# Patient Record
Sex: Female | Born: 1965 | Race: White | Hispanic: No | Marital: Married | Smoking: Never smoker
Health system: Southern US, Community
[De-identification: ages and names within clinical notes are randomized; demographics above are authoritative.]

## PROBLEM LIST (undated history)

## (undated) DIAGNOSIS — C419 Malignant neoplasm of bone and articular cartilage, unspecified: Secondary | ICD-10-CM

---

## 2001-06-17 ENCOUNTER — Other Ambulatory Visit: Admission: RE | Admit: 2001-06-17 | Discharge: 2001-06-17 | Payer: Self-pay | Admitting: *Deleted

## 2002-09-20 ENCOUNTER — Other Ambulatory Visit: Admission: RE | Admit: 2002-09-20 | Discharge: 2002-09-20 | Payer: Self-pay | Admitting: *Deleted

## 2003-11-05 ENCOUNTER — Other Ambulatory Visit: Admission: RE | Admit: 2003-11-05 | Discharge: 2003-11-05 | Payer: Self-pay | Admitting: *Deleted

## 2005-08-04 ENCOUNTER — Other Ambulatory Visit: Admission: RE | Admit: 2005-08-04 | Discharge: 2005-08-04 | Payer: Self-pay | Admitting: Obstetrics and Gynecology

## 2006-11-18 ENCOUNTER — Encounter: Admission: RE | Admit: 2006-11-18 | Discharge: 2006-11-18 | Payer: Self-pay | Admitting: Obstetrics and Gynecology

## 2007-11-29 ENCOUNTER — Encounter: Admission: RE | Admit: 2007-11-29 | Discharge: 2007-11-29 | Payer: Self-pay | Admitting: Obstetrics and Gynecology

## 2010-11-16 ENCOUNTER — Encounter: Payer: Self-pay | Admitting: Obstetrics and Gynecology

## 2012-04-05 ENCOUNTER — Other Ambulatory Visit (HOSPITAL_COMMUNITY): Payer: Self-pay | Admitting: Internal Medicine

## 2012-04-05 DIAGNOSIS — Z139 Encounter for screening, unspecified: Secondary | ICD-10-CM

## 2012-04-08 ENCOUNTER — Ambulatory Visit (HOSPITAL_COMMUNITY): Payer: Self-pay

## 2015-10-27 HISTORY — PX: OTHER SURGICAL HISTORY: SHX169

## 2015-12-02 DIAGNOSIS — M1611 Unilateral primary osteoarthritis, right hip: Secondary | ICD-10-CM | POA: Insufficient documentation

## 2015-12-03 DIAGNOSIS — M87051 Idiopathic aseptic necrosis of right femur: Secondary | ICD-10-CM | POA: Insufficient documentation

## 2016-06-17 ENCOUNTER — Other Ambulatory Visit: Payer: Self-pay | Admitting: Family Medicine

## 2016-06-17 DIAGNOSIS — Z1231 Encounter for screening mammogram for malignant neoplasm of breast: Secondary | ICD-10-CM

## 2016-07-20 ENCOUNTER — Ambulatory Visit: Payer: Self-pay

## 2016-07-20 ENCOUNTER — Encounter: Payer: Self-pay | Admitting: Family Medicine

## 2016-08-31 ENCOUNTER — Encounter: Payer: Self-pay | Admitting: Family Medicine

## 2016-08-31 ENCOUNTER — Ambulatory Visit (INDEPENDENT_AMBULATORY_CARE_PROVIDER_SITE_OTHER): Payer: Managed Care, Other (non HMO) | Admitting: Family Medicine

## 2016-08-31 ENCOUNTER — Ambulatory Visit
Admission: RE | Admit: 2016-08-31 | Discharge: 2016-08-31 | Disposition: A | Discharge status: Home / Self Care | Payer: Managed Care, Other (non HMO) | Source: Ambulatory Visit | Attending: Family Medicine | Admitting: Family Medicine

## 2016-08-31 VITALS — BP 130/73 | Ht 69.0 in | Wt 202.0 lb

## 2016-08-31 DIAGNOSIS — Z01419 Encounter for gynecological examination (general) (routine) without abnormal findings: Secondary | ICD-10-CM | POA: Diagnosis not present

## 2016-08-31 DIAGNOSIS — Z1151 Encounter for screening for human papillomavirus (HPV): Secondary | ICD-10-CM | POA: Diagnosis not present

## 2016-08-31 DIAGNOSIS — Z1239 Encounter for other screening for malignant neoplasm of breast: Secondary | ICD-10-CM

## 2016-08-31 DIAGNOSIS — Z1231 Encounter for screening mammogram for malignant neoplasm of breast: Secondary | ICD-10-CM

## 2016-08-31 DIAGNOSIS — Z23 Encounter for immunization: Secondary | ICD-10-CM | POA: Diagnosis not present

## 2016-08-31 DIAGNOSIS — Z124 Encounter for screening for malignant neoplasm of cervix: Secondary | ICD-10-CM | POA: Diagnosis not present

## 2016-08-31 LAB — COMPREHENSIVE METABOLIC PANEL
ALT: 28 U/L (ref 6–29)
AST: 22 U/L (ref 10–35)
Albumin: 4.5 g/dL (ref 3.6–5.1)
Alkaline Phosphatase: 92 U/L (ref 33–115)
BUN: 12 mg/dL (ref 7–25)
CHLORIDE: 105 mmol/L (ref 98–110)
CO2: 26 mmol/L (ref 20–31)
Calcium: 9.6 mg/dL (ref 8.6–10.2)
Creat: 0.96 mg/dL (ref 0.50–1.10)
Glucose, Bld: 97 mg/dL (ref 65–99)
POTASSIUM: 4.2 mmol/L (ref 3.5–5.3)
Sodium: 140 mmol/L (ref 135–146)
TOTAL PROTEIN: 7.1 g/dL (ref 6.1–8.1)
Total Bilirubin: 0.5 mg/dL (ref 0.2–1.2)

## 2016-08-31 LAB — CBC
HEMATOCRIT: 40.4 % (ref 35.0–45.0)
HEMOGLOBIN: 13.4 g/dL (ref 11.7–15.5)
MCH: 31.6 pg (ref 27.0–33.0)
MCHC: 33.2 g/dL (ref 32.0–36.0)
MCV: 95.3 fL (ref 80.0–100.0)
MPV: 10 fL (ref 7.5–12.5)
Platelets: 309 10*3/uL (ref 140–400)
RBC: 4.24 MIL/uL (ref 3.80–5.10)
RDW: 13 % (ref 11.0–15.0)
WBC: 5.1 10*3/uL (ref 3.8–10.8)

## 2016-08-31 LAB — TSH: TSH: 2.46 mIU/L

## 2016-08-31 LAB — LIPID PANEL
Cholesterol: 161 mg/dL (ref <200)
HDL: 45 mg/dL — AB (ref >50)
LDL CALC: 95 mg/dL
TRIGLYCERIDES: 104 mg/dL (ref <150)
Total CHOL/HDL Ratio: 3.6 Ratio (ref <5.0)
VLDL: 21 mg/dL (ref <30)

## 2016-08-31 MED ORDER — TETANUS-DIPHTH-ACELL PERTUSSIS 5-2.5-18.5 LF-MCG/0.5 IM SUSP
0.5000 mL | Freq: Once | INTRAMUSCULAR | Status: AC
  Administered 2016-08-31: 0.5 mL via INTRAMUSCULAR

## 2016-08-31 NOTE — Patient Instructions (Signed)
Preventive Care for Adults, Female A healthy lifestyle and preventive care can promote health and wellness. Preventive health guidelines for women include the following key practices.  A routine yearly physical is a good way to check with your health care provider about your health and preventive screening. It is a chance to share any concerns and updates on your health and to receive a thorough exam.  Visit your dentist for a routine exam and preventive care every 6 months. Brush your teeth twice a day and floss once a day. Good oral hygiene prevents tooth decay and gum disease.  The frequency of eye exams is based on your age, health, family medical history, use of contact lenses, and other factors. Follow your health care provider's recommendations for frequency of eye exams.  Eat a healthy diet. Foods like vegetables, fruits, whole grains, low-fat dairy products, and lean protein foods contain the nutrients you need without too many calories. Decrease your intake of foods high in solid fats, added sugars, and salt. Eat the right amount of calories for you.Get information about a proper diet from your health care provider, if necessary.  Regular physical exercise is one of the most important things you can do for your health. Most adults should get at least 150 minutes of moderate-intensity exercise (any activity that increases your heart rate and causes you to sweat) each week. In addition, most adults need muscle-strengthening exercises on 2 or more days a week.  Maintain a healthy weight. The body mass index (BMI) is a screening tool to identify possible weight problems. It provides an estimate of body fat based on height and weight. Your health care provider can find your BMI and can help you achieve or maintain a healthy weight.For adults 20 years and older:  A BMI below 18.5 is considered underweight.  A BMI of 18.5 to 24.9 is normal.  A BMI of 25 to 29.9 is considered overweight.  A  BMI of 30 and above is considered obese.  Maintain normal blood lipids and cholesterol levels by exercising and minimizing your intake of saturated fat. Eat a balanced diet with plenty of fruit and vegetables. Blood tests for lipids and cholesterol should begin at age 45 and be repeated every 5 years. If your lipid or cholesterol levels are high, you are over 50, or you are at high risk for heart disease, you may need your cholesterol levels checked more frequently.Ongoing high lipid and cholesterol levels should be treated with medicines if diet and exercise are not working.  If you smoke, find out from your health care provider how to quit. If you do not use tobacco, do not start.  Lung cancer screening is recommended for adults aged 45-80 years who are at high risk for developing lung cancer because of a history of smoking. A yearly low-dose CT scan of the lungs is recommended for people who have at least a 30-pack-year history of smoking and are a current smoker or have quit within the past 15 years. A pack year of smoking is smoking an average of 1 pack of cigarettes a day for 1 year (for example: 1 pack a day for 30 years or 2 packs a day for 15 years). Yearly screening should continue until the smoker has stopped smoking for at least 15 years. Yearly screening should be stopped for people who develop a health problem that would prevent them from having lung cancer treatment.  If you are pregnant, do not drink alcohol. If you are  breastfeeding, be very cautious about drinking alcohol. If you are not pregnant and choose to drink alcohol, do not have more than 1 drink per day. One drink is considered to be 12 ounces (355 mL) of beer, 5 ounces (148 mL) of wine, or 1.5 ounces (44 mL) of liquor.  Avoid use of street drugs. Do not share needles with anyone. Ask for help if you need support or instructions about stopping the use of drugs.  High blood pressure causes heart disease and increases the risk  of stroke. Your blood pressure should be checked at least every 1 to 2 years. Ongoing high blood pressure should be treated with medicines if weight loss and exercise do not work.  If you are 55-79 years old, ask your health care provider if you should take aspirin to prevent strokes.  Diabetes screening is done by taking a blood sample to check your blood glucose level after you have not eaten for a certain period of time (fasting). If you are not overweight and you do not have risk factors for diabetes, you should be screened once every 3 years starting at age 45. If you are overweight or obese and you are 40-70 years of age, you should be screened for diabetes every year as part of your cardiovascular risk assessment.  Breast cancer screening is essential preventive care for women. You should practice "breast self-awareness." This means understanding the normal appearance and feel of your breasts and may include breast self-examination. Any changes detected, no matter how small, should be reported to a health care provider. Women in their 20s and 30s should have a clinical breast exam (CBE) by a health care provider as part of a regular health exam every 1 to 3 years. After age 40, women should have a CBE every year. Starting at age 40, women should consider having a mammogram (breast X-ray test) every year. Women who have a family history of breast cancer should talk to their health care provider about genetic screening. Women at a high risk of breast cancer should talk to their health care providers about having an MRI and a mammogram every year.  Breast cancer gene (BRCA)-related cancer risk assessment is recommended for women who have family members with BRCA-related cancers. BRCA-related cancers include breast, ovarian, tubal, and peritoneal cancers. Having family members with these cancers may be associated with an increased risk for harmful changes (mutations) in the breast cancer genes BRCA1 and  BRCA2. Results of the assessment will determine the need for genetic counseling and BRCA1 and BRCA2 testing.  Your health care provider may recommend that you be screened regularly for cancer of the pelvic organs (ovaries, uterus, and vagina). This screening involves a pelvic examination, including checking for microscopic changes to the surface of your cervix (Pap test). You may be encouraged to have this screening done every 3 years, beginning at age 21.  For women ages 30-65, health care providers may recommend pelvic exams and Pap testing every 3 years, or they may recommend the Pap and pelvic exam, combined with testing for human papilloma virus (HPV), every 5 years. Some types of HPV increase your risk of cervical cancer. Testing for HPV may also be done on women of any age with unclear Pap test results.  Other health care providers may not recommend any screening for nonpregnant women who are considered low risk for pelvic cancer and who do not have symptoms. Ask your health care provider if a screening pelvic exam is right for   you.  If you have had past treatment for cervical cancer or a condition that could lead to cancer, you need Pap tests and screening for cancer for at least 20 years after your treatment. If Pap tests have been discontinued, your risk factors (such as having a new sexual partner) need to be reassessed to determine if screening should resume. Some women have medical problems that increase the chance of getting cervical cancer. In these cases, your health care provider may recommend more frequent screening and Pap tests.  Colorectal cancer can be detected and often prevented. Most routine colorectal cancer screening begins at the age of 50 years and continues through age 75 years. However, your health care provider may recommend screening at an earlier age if you have risk factors for colon cancer. On a yearly basis, your health care provider may provide home test kits to check  for hidden blood in the stool. Use of a small camera at the end of a tube, to directly examine the colon (sigmoidoscopy or colonoscopy), can detect the earliest forms of colorectal cancer. Talk to your health care provider about this at age 50, when routine screening begins. Direct exam of the colon should be repeated every 5-10 years through age 75 years, unless early forms of precancerous polyps or small growths are found.  People who are at an increased risk for hepatitis B should be screened for this virus. You are considered at high risk for hepatitis B if:  You were born in a country where hepatitis B occurs often. Talk with your health care provider about which countries are considered high risk.  Your parents were born in a high-risk country and you have not received a shot to protect against hepatitis B (hepatitis B vaccine).  You have HIV or AIDS.  You use needles to inject street drugs.  You live with, or have sex with, someone who has hepatitis B.  You get hemodialysis treatment.  You take certain medicines for conditions like cancer, organ transplantation, and autoimmune conditions.  Hepatitis C blood testing is recommended for all people born from 1945 through 1965 and any individual with known risks for hepatitis C.  Practice safe sex. Use condoms and avoid high-risk sexual practices to reduce the spread of sexually transmitted infections (STIs). STIs include gonorrhea, chlamydia, syphilis, trichomonas, herpes, HPV, and human immunodeficiency virus (HIV). Herpes, HIV, and HPV are viral illnesses that have no cure. They can result in disability, cancer, and death.  You should be screened for sexually transmitted illnesses (STIs) including gonorrhea and chlamydia if:  You are sexually active and are younger than 24 years.  You are older than 24 years and your health care provider tells you that you are at risk for this type of infection.  Your sexual activity has changed  since you were last screened and you are at an increased risk for chlamydia or gonorrhea. Ask your health care provider if you are at risk.  If you are at risk of being infected with HIV, it is recommended that you take a prescription medicine daily to prevent HIV infection. This is called preexposure prophylaxis (PrEP). You are considered at risk if:  You are sexually active and do not regularly use condoms or know the HIV status of your partner(s).  You take drugs by injection.  You are sexually active with a partner who has HIV.  Talk with your health care provider about whether you are at high risk of being infected with HIV. If   you choose to begin PrEP, you should first be tested for HIV. You should then be tested every 3 months for as long as you are taking PrEP.  Osteoporosis is a disease in which the bones lose minerals and strength with aging. This can result in serious bone fractures or breaks. The risk of osteoporosis can be identified using a bone density scan. Women ages 67 years and over and women at risk for fractures or osteoporosis should discuss screening with their health care providers. Ask your health care provider whether you should take a calcium supplement or vitamin D to reduce the rate of osteoporosis.  Menopause can be associated with physical symptoms and risks. Hormone replacement therapy is available to decrease symptoms and risks. You should talk to your health care provider about whether hormone replacement therapy is right for you.  Use sunscreen. Apply sunscreen liberally and repeatedly throughout the day. You should seek shade when your shadow is shorter than you. Protect yourself by wearing long sleeves, pants, a wide-brimmed hat, and sunglasses year round, whenever you are outdoors.  Once a month, do a whole body skin exam, using a mirror to look at the skin on your back. Tell your health care provider of new moles, moles that have irregular borders, moles that  are larger than a pencil eraser, or moles that have changed in shape or color.  Stay current with required vaccines (immunizations).  Influenza vaccine. All adults should be immunized every year.  Tetanus, diphtheria, and acellular pertussis (Td, Tdap) vaccine. Pregnant women should receive 1 dose of Tdap vaccine during each pregnancy. The dose should be obtained regardless of the length of time since the last dose. Immunization is preferred during the 27th-36th week of gestation. An adult who has not previously received Tdap or who does not know her vaccine status should receive 1 dose of Tdap. This initial dose should be followed by tetanus and diphtheria toxoids (Td) booster doses every 10 years. Adults with an unknown or incomplete history of completing a 3-dose immunization series with Td-containing vaccines should begin or complete a primary immunization series including a Tdap dose. Adults should receive a Td booster every 10 years.  Varicella vaccine. An adult without evidence of immunity to varicella should receive 2 doses or a second dose if she has previously received 1 dose. Pregnant females who do not have evidence of immunity should receive the first dose after pregnancy. This first dose should be obtained before leaving the health care facility. The second dose should be obtained 4-8 weeks after the first dose.  Human papillomavirus (HPV) vaccine. Females aged 13-26 years who have not received the vaccine previously should obtain the 3-dose series. The vaccine is not recommended for use in pregnant females. However, pregnancy testing is not needed before receiving a dose. If a female is found to be pregnant after receiving a dose, no treatment is needed. In that case, the remaining doses should be delayed until after the pregnancy. Immunization is recommended for any person with an immunocompromised condition through the age of 61 years if she did not get any or all doses earlier. During the  3-dose series, the second dose should be obtained 4-8 weeks after the first dose. The third dose should be obtained 24 weeks after the first dose and 16 weeks after the second dose.  Zoster vaccine. One dose is recommended for adults aged 30 years or older unless certain conditions are present.  Measles, mumps, and rubella (MMR) vaccine. Adults born  before 1957 generally are considered immune to measles and mumps. Adults born in 1957 or later should have 1 or more doses of MMR vaccine unless there is a contraindication to the vaccine or there is laboratory evidence of immunity to each of the three diseases. A routine second dose of MMR vaccine should be obtained at least 28 days after the first dose for students attending postsecondary schools, health care workers, or international travelers. People who received inactivated measles vaccine or an unknown type of measles vaccine during 1963-1967 should receive 2 doses of MMR vaccine. People who received inactivated mumps vaccine or an unknown type of mumps vaccine before 1979 and are at high risk for mumps infection should consider immunization with 2 doses of MMR vaccine. For females of childbearing age, rubella immunity should be determined. If there is no evidence of immunity, females who are not pregnant should be vaccinated. If there is no evidence of immunity, females who are pregnant should delay immunization until after pregnancy. Unvaccinated health care workers born before 1957 who lack laboratory evidence of measles, mumps, or rubella immunity or laboratory confirmation of disease should consider measles and mumps immunization with 2 doses of MMR vaccine or rubella immunization with 1 dose of MMR vaccine.  Pneumococcal 13-valent conjugate (PCV13) vaccine. When indicated, a person who is uncertain of his immunization history and has no record of immunization should receive the PCV13 vaccine. All adults 65 years of age and older should receive this  vaccine. An adult aged 19 years or older who has certain medical conditions and has not been previously immunized should receive 1 dose of PCV13 vaccine. This PCV13 should be followed with a dose of pneumococcal polysaccharide (PPSV23) vaccine. Adults who are at high risk for pneumococcal disease should obtain the PPSV23 vaccine at least 8 weeks after the dose of PCV13 vaccine. Adults older than 48 years of age who have normal immune system function should obtain the PPSV23 vaccine dose at least 1 year after the dose of PCV13 vaccine.  Pneumococcal polysaccharide (PPSV23) vaccine. When PCV13 is also indicated, PCV13 should be obtained first. All adults aged 65 years and older should be immunized. An adult younger than age 65 years who has certain medical conditions should be immunized. Any person who resides in a nursing home or long-term care facility should be immunized. An adult smoker should be immunized. People with an immunocompromised condition and certain other conditions should receive both PCV13 and PPSV23 vaccines. People with human immunodeficiency virus (HIV) infection should be immunized as soon as possible after diagnosis. Immunization during chemotherapy or radiation therapy should be avoided. Routine use of PPSV23 vaccine is not recommended for American Indians, [place] Natives, or people younger than 65 years unless there are medical conditions that require PPSV23 vaccine. When indicated, people who have unknown immunization and have no record of immunization should receive PPSV23 vaccine. One-time revaccination 5 years after the first dose of PPSV23 is recommended for people aged 19-64 years who have chronic kidney failure, nephrotic syndrome, asplenia, or immunocompromised conditions. People who received 1-2 doses of PPSV23 before age 65 years should receive another dose of PPSV23 vaccine at age 65 years or later if at least 5 years have passed since the previous dose. Doses of PPSV23 are not  needed for people immunized with PPSV23 at or after age 65 years.  Meningococcal vaccine. Adults with asplenia or persistent complement component deficiencies should receive 2 doses of quadrivalent meningococcal conjugate (MenACWY-D) vaccine. The doses should be obtained   at least 2 months apart. Microbiologists working with certain meningococcal bacteria, [place] recruits, people at risk during an outbreak, and people who travel to or live in countries with a high rate of meningitis should be immunized. A first-year college student up through age 34 years who is living in a residence hall should receive a dose if she did not receive a dose on or after her 16th birthday. Adults who have certain high-risk conditions should receive one or more doses of vaccine.  Hepatitis A vaccine. Adults who wish to be protected from this disease, have certain high-risk conditions, work with hepatitis A-infected animals, work in hepatitis A research labs, or travel to or work in countries with a high rate of hepatitis A should be immunized. Adults who were previously unvaccinated and who anticipate close contact with an international adoptee during the first 60 days after arrival in the [place] from a country with a high rate of hepatitis A should be immunized.  Hepatitis B vaccine. Adults who wish to be protected from this disease, have certain high-risk conditions, may be exposed to blood or other infectious body fluids, are household contacts or sex partners of hepatitis B positive people, are clients or workers in certain care facilities, or travel to or work in countries with a high rate of hepatitis B should be immunized.  Haemophilus influenzae type b (Hib) vaccine. A previously unvaccinated person with asplenia or sickle cell disease or having a scheduled splenectomy should receive 1 dose of Hib vaccine. Regardless of previous immunization, a recipient of a hematopoietic stem cell transplant should receive a  3-dose series 6-12 months after her successful transplant. Hib vaccine is not recommended for adults with HIV infection. Preventive Services / Frequency Ages 35 to 4 years  Blood pressure check.** / Every 3-5 years.  Lipid and cholesterol check.** / Every 5 years beginning at age 60.  Clinical breast exam.** / Every 3 years for women in their 71s and 10s.  BRCA-related cancer risk assessment.** / For women who have family members with a BRCA-related cancer (breast, ovarian, tubal, or peritoneal cancers).  Pap test.** / Every 2 years from ages 76 through 26. Every 3 years starting at age 61 through age 76 or 93 with a history of 3 consecutive normal Pap tests.  HPV screening.** / Every 3 years from ages 37 through ages 60 to 51 with a history of 3 consecutive normal Pap tests.  Hepatitis C blood test.** / For any individual with known risks for hepatitis C.  Skin self-exam. / Monthly.  Influenza vaccine. / Every year.  Tetanus, diphtheria, and acellular pertussis (Tdap, Td) vaccine.** / Consult your health care provider. Pregnant women should receive 1 dose of Tdap vaccine during each pregnancy. 1 dose of Td every 10 years.  Varicella vaccine.** / Consult your health care provider. Pregnant females who do not have evidence of immunity should receive the first dose after pregnancy.  HPV vaccine. / 3 doses over 6 months, if 93 and younger. The vaccine is not recommended for use in pregnant females. However, pregnancy testing is not needed before receiving a dose.  Measles, mumps, rubella (MMR) vaccine.** / You need at least 1 dose of MMR if you were born in 1957 or later. You may also need a 2nd dose. For females of childbearing age, rubella immunity should be determined. If there is no evidence of immunity, females who are not pregnant should be vaccinated. If there is no evidence of immunity, females who are  pregnant should delay immunization until after pregnancy.  Pneumococcal  13-valent conjugate (PCV13) vaccine.** / Consult your health care provider.  Pneumococcal polysaccharide (PPSV23) vaccine.** / 1 to 2 doses if you smoke cigarettes or if you have certain conditions.  Meningococcal vaccine.** / 1 dose if you are age 68 to 8 years and a Market researcher living in a residence hall, or have one of several medical conditions, you need to get vaccinated against meningococcal disease. You may also need additional booster doses.  Hepatitis A vaccine.** / Consult your health care provider.  Hepatitis B vaccine.** / Consult your health care provider.  Haemophilus influenzae type b (Hib) vaccine.** / Consult your health care provider. Ages 7 to 53 years  Blood pressure check.** / Every year.  Lipid and cholesterol check.** / Every 5 years beginning at age 25 years.  Lung cancer screening. / Every year if you are aged 11-80 years and have a 30-pack-year history of smoking and currently smoke or have quit within the past 15 years. Yearly screening is stopped once you have quit smoking for at least 15 years or develop a health problem that would prevent you from having lung cancer treatment.  Clinical breast exam.** / Every year after age 48 years.  BRCA-related cancer risk assessment.** / For women who have family members with a BRCA-related cancer (breast, ovarian, tubal, or peritoneal cancers).  Mammogram.** / Every year beginning at age 41 years and continuing for as long as you are in good health. Consult with your health care provider.  Pap test.** / Every 3 years starting at age 65 years through age 37 or 70 years with a history of 3 consecutive normal Pap tests.  HPV screening.** / Every 3 years from ages 72 years through ages 60 to 40 years with a history of 3 consecutive normal Pap tests.  Fecal occult blood test (FOBT) of stool. / Every year beginning at age 21 years and continuing until age 5 years. You may not need to do this test if you get  a colonoscopy every 10 years.  Flexible sigmoidoscopy or colonoscopy.** / Every 5 years for a flexible sigmoidoscopy or every 10 years for a colonoscopy beginning at age 35 years and continuing until age 48 years.  Hepatitis C blood test.** / For all people born from [birth year] through 1965 and any individual with known risks for hepatitis C.  Skin self-exam. / Monthly.  Influenza vaccine. / Every year.  Tetanus, diphtheria, and acellular pertussis (Tdap/Td) vaccine.** / Consult your health care provider. Pregnant women should receive 1 dose of Tdap vaccine during each pregnancy. 1 dose of Td every 10 years.  Varicella vaccine.** / Consult your health care provider. Pregnant females who do not have evidence of immunity should receive the first dose after pregnancy.  Zoster vaccine.** / 1 dose for adults aged 30 years or older.  Measles, mumps, rubella (MMR) vaccine.** / You need at least 1 dose of MMR if you were born in 1957 or later. You may also need a second dose. For females of childbearing age, rubella immunity should be determined. If there is no evidence of immunity, females who are not pregnant should be vaccinated. If there is no evidence of immunity, females who are pregnant should delay immunization until after pregnancy.  Pneumococcal 13-valent conjugate (PCV13) vaccine.** / Consult your health care provider.  Pneumococcal polysaccharide (PPSV23) vaccine.** / 1 to 2 doses if you smoke cigarettes or if you have certain conditions.  Meningococcal vaccine.** /  Consult your health care provider.  Hepatitis A vaccine.** / Consult your health care provider.  Hepatitis B vaccine.** / Consult your health care provider.  Haemophilus influenzae type b (Hib) vaccine.** / Consult your health care provider. Ages 64 years and over  Blood pressure check.** / Every year.  Lipid and cholesterol check.** / Every 5 years beginning at age 23 years.  Lung cancer screening. / Every year if you  are aged 16-80 years and have a 30-pack-year history of smoking and currently smoke or have quit within the past 15 years. Yearly screening is stopped once you have quit smoking for at least 15 years or develop a health problem that would prevent you from having lung cancer treatment.  Clinical breast exam.** / Every year after age 74 years.  BRCA-related cancer risk assessment.** / For women who have family members with a BRCA-related cancer (breast, ovarian, tubal, or peritoneal cancers).  Mammogram.** / Every year beginning at age 44 years and continuing for as long as you are in good health. Consult with your health care provider.  Pap test.** / Every 3 years starting at age 58 years through age 22 or 39 years with 3 consecutive normal Pap tests. Testing can be stopped between 65 and 70 years with 3 consecutive normal Pap tests and no abnormal Pap or HPV tests in the past 10 years.  HPV screening.** / Every 3 years from ages 64 years through ages 70 or 61 years with a history of 3 consecutive normal Pap tests. Testing can be stopped between 65 and 70 years with 3 consecutive normal Pap tests and no abnormal Pap or HPV tests in the past 10 years.  Fecal occult blood test (FOBT) of stool. / Every year beginning at age 40 years and continuing until age 27 years. You may not need to do this test if you get a colonoscopy every 10 years.  Flexible sigmoidoscopy or colonoscopy.** / Every 5 years for a flexible sigmoidoscopy or every 10 years for a colonoscopy beginning at age 7 years and continuing until age 32 years.  Hepatitis C blood test.** / For all people born from [birth year] through 1963 and any individual with known risks for hepatitis C.  Osteoporosis screening.** / A one-time screening for women ages 30 years and over and women at risk for fractures or osteoporosis.  Skin self-exam. / Monthly.  Influenza vaccine. / Every year.  Tetanus, diphtheria, and acellular pertussis (Tdap/Td)  vaccine.** / 1 dose of Td every 10 years.  Varicella vaccine.** / Consult your health care provider.  Zoster vaccine.** / 1 dose for adults aged 35 years or older.  Pneumococcal 13-valent conjugate (PCV13) vaccine.** / Consult your health care provider.  Pneumococcal polysaccharide (PPSV23) vaccine.** / 1 dose for all adults aged 46 years and older.  Meningococcal vaccine.** / Consult your health care provider.  Hepatitis A vaccine.** / Consult your health care provider.  Hepatitis B vaccine.** / Consult your health care provider.  Haemophilus influenzae type b (Hib) vaccine.** / Consult your health care provider. ** Family history and personal history of risk and conditions may change your health care provider's recommendations.   This information is not intended to replace advice given to you by your health care provider. Make sure you discuss any questions you have with your health care provider.   Document Released: 12/08/2001 Document Revised: 11/02/2014 Document Reviewed: 03/09/2011 Elsevier Interactive Patient Education [employer].

## 2016-08-31 NOTE — Progress Notes (Signed)
   Subjective:     Mackenzie Glenn is a 50 y.o. female and is here for a comprehensive physical exam. The patient reports no problems.  Social History   Social History  . Marital status: Married    Spouse name: Gale JourneyDana Dinapoli  . Number of children: 0  . Years of education: 3413   Occupational History  . Not on file.   Social History Main Topics  . Smoking status: Never Smoker  . Smokeless tobacco: Never Used  . Alcohol use 3.6 oz/week    2 Cans of beer, 4 Glasses of wine per week  . Drug use: No  . Sexual activity: Yes    Partners: Male    Birth control/ protection: None   Other Topics Concern  . Not on file   Social History Narrative  . No narrative on file   Health Maintenance  Topic Date Due  . HIV Screening  07/11/1983  . TETANUS/TDAP  07/11/1987  . PAP SMEAR  07/10/1989  . INFLUENZA VACCINE  05/26/2016    The following portions of the patient's history were reviewed and updated as appropriate: allergies, current medications, past family history, past medical history, past social history, past surgical history and problem list.  Review of Systems Pertinent items noted in HPI and remainder of comprehensive ROS otherwise negative.   Objective:    BP 130/73   Ht 5\' 9"  (1.753 m)   Wt 202 lb (91.6 kg)   LMP 09/26/2007 (Approximate)   BMI 29.83 kg/m  General appearance: alert, cooperative and appears stated age Head: Normocephalic, without obvious abnormality, atraumatic Neck: no adenopathy, supple, symmetrical, trachea midline and thyroid not enlarged, symmetric, no tenderness/mass/nodules Lungs: clear to auscultation bilaterally Breasts: normal appearance, no masses or tenderness Heart: regular rate and rhythm, S1, S2 normal, no murmur, click, rub or gallop Abdomen: soft, non-tender; bowel sounds normal; no masses,  no organomegaly Pelvic: cervix normal in appearance, external genitalia normal, no adnexal masses or tenderness, no cervical motion tenderness,  uterus normal size, shape, and consistency and vaginal atrophy Extremities: extremities normal, atraumatic, no cyanosis or edema Pulses: 2+ and symmetric Skin: Skin color, texture, turgor normal. No rashes or lesions Lymph nodes: Cervical, supraclavicular, and axillary nodes normal. Neurologic: Grossly normal    Assessment:    Healthy female exam.      Plan:      Problem List Items Addressed This Visit    None    Visit Diagnoses    Encounter for gynecological examination without abnormal finding    -  Primary   Relevant Orders   Comprehensive metabolic panel   CBC   TSH   Hemoglobin A1c   Lipid panel   VITAMIN D 25 Hydroxy (Vit-D Deficiency, Fractures)   Screening for malignant neoplasm of cervix       Relevant Orders   Cytology - PAP   Screening for breast cancer       Need for immunization against influenza       Relevant Orders   Flu Vaccine QUAD 36+ mos IM (Completed)   Need for immunization against pertussis       Relevant Medications   Tdap (BOOSTRIX) injection 0.5 mL (Completed)      See After Visit Summary for Counseling Recommendations

## 2016-09-01 LAB — HEMOGLOBIN A1C
HEMOGLOBIN A1C: 5.4 % (ref <5.7)
MEAN PLASMA GLUCOSE: 108 mg/dL

## 2016-09-01 LAB — VITAMIN D 25 HYDROXY (VIT D DEFICIENCY, FRACTURES): Vit D, 25-Hydroxy: 44 ng/mL (ref 30–100)

## 2016-09-03 LAB — CYTOLOGY - PAP
ADEQUACY: ABSENT
DIAGNOSIS: NEGATIVE
HPV (WINDOPATH): DETECTED — AB
HPV 16/18/45 GENOTYPING: NEGATIVE

## 2016-10-02 ENCOUNTER — Other Ambulatory Visit: Payer: Self-pay | Admitting: Family Medicine

## 2016-10-02 MED ORDER — FAMCICLOVIR 500 MG PO TABS: 500.0000 mg | ORAL_TABLET | Freq: Three times a day (TID) | ORAL | 0 refills | Status: AC

## 2016-10-02 NOTE — Progress Notes (Unsigned)
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

## 2016-12-29 ENCOUNTER — Encounter: Payer: Self-pay | Admitting: *Deleted

## 2018-11-15 ENCOUNTER — Other Ambulatory Visit: Payer: Self-pay | Admitting: Family Medicine

## 2018-11-15 DIAGNOSIS — Z1231 Encounter for screening mammogram for malignant neoplasm of breast: Secondary | ICD-10-CM

## 2018-11-17 ENCOUNTER — Ambulatory Visit
Admission: RE | Admit: 2018-11-17 | Discharge: 2018-11-17 | Disposition: A | Discharge status: Home / Self Care | Payer: Managed Care, Other (non HMO) | Source: Ambulatory Visit | Attending: Family Medicine | Admitting: Family Medicine

## 2018-11-17 DIAGNOSIS — Z1231 Encounter for screening mammogram for malignant neoplasm of breast: Secondary | ICD-10-CM

## 2020-08-11 IMAGING — MG DIGITAL SCREENING BILATERAL MAMMOGRAM WITH TOMO AND CAD
8 series · 8 of 24 positions shown · non-contrast
Comparison: Previous exam(s).

CLINICAL DATA: Screening.

EXAM:
DIGITAL SCREENING BILATERAL MAMMOGRAM WITH TOMO AND CAD

[L MLO synth-2D]
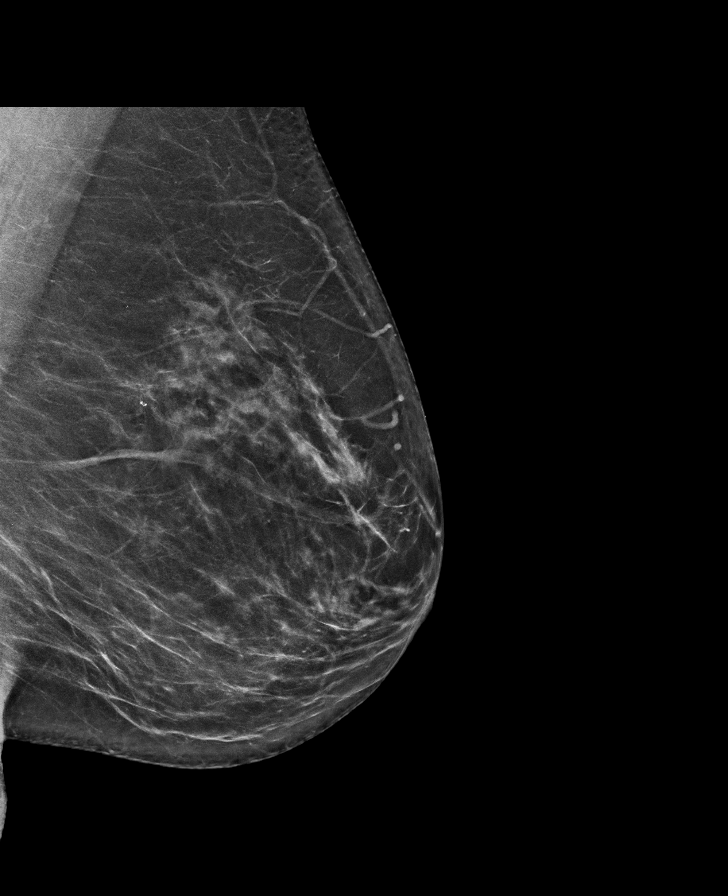

[R MLO synth-2D]
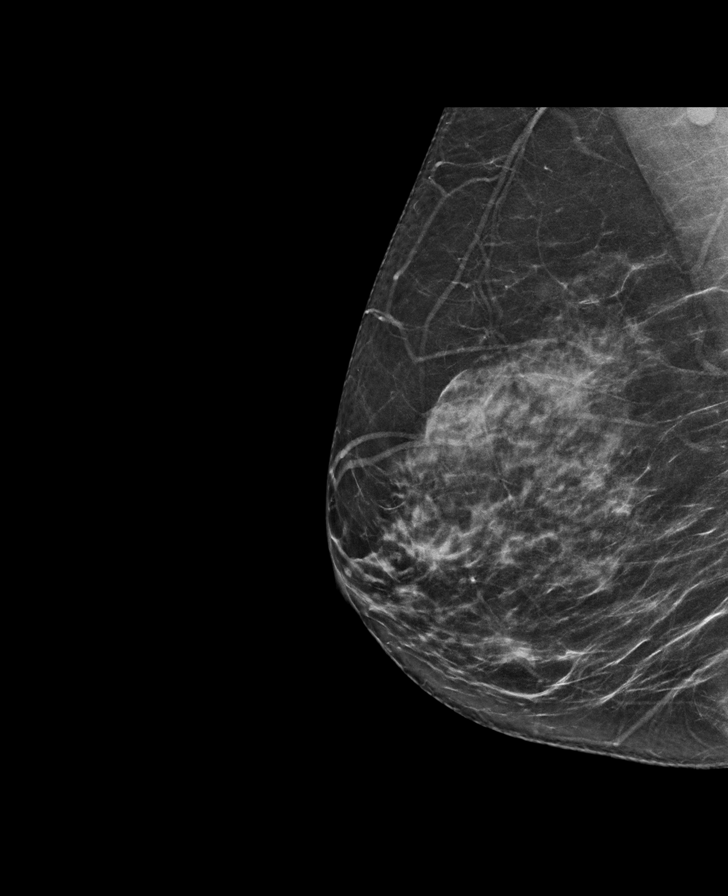

[L CC synth-2D]
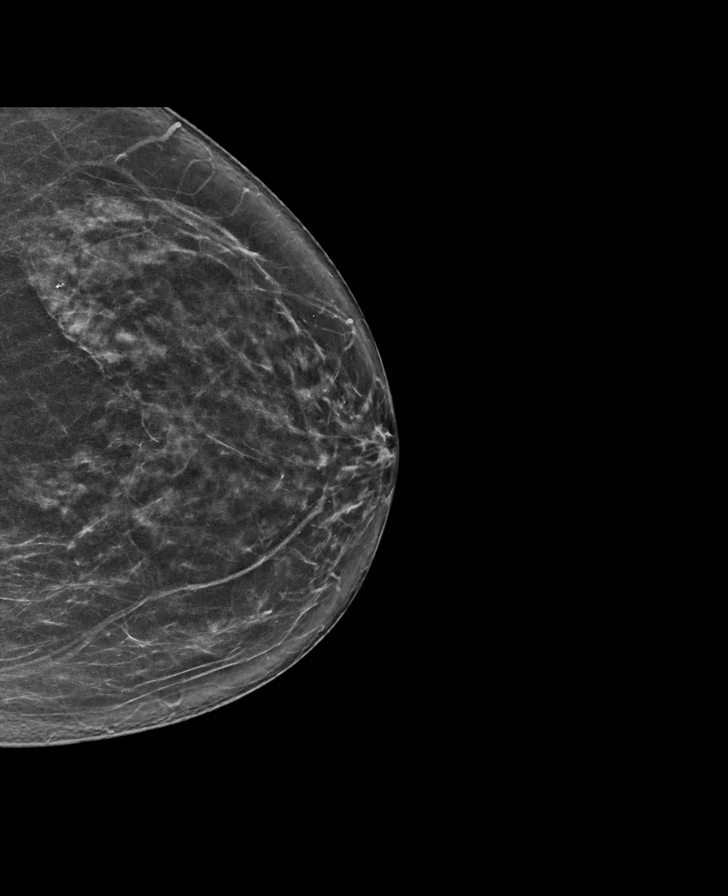

[R CC synth-2D]
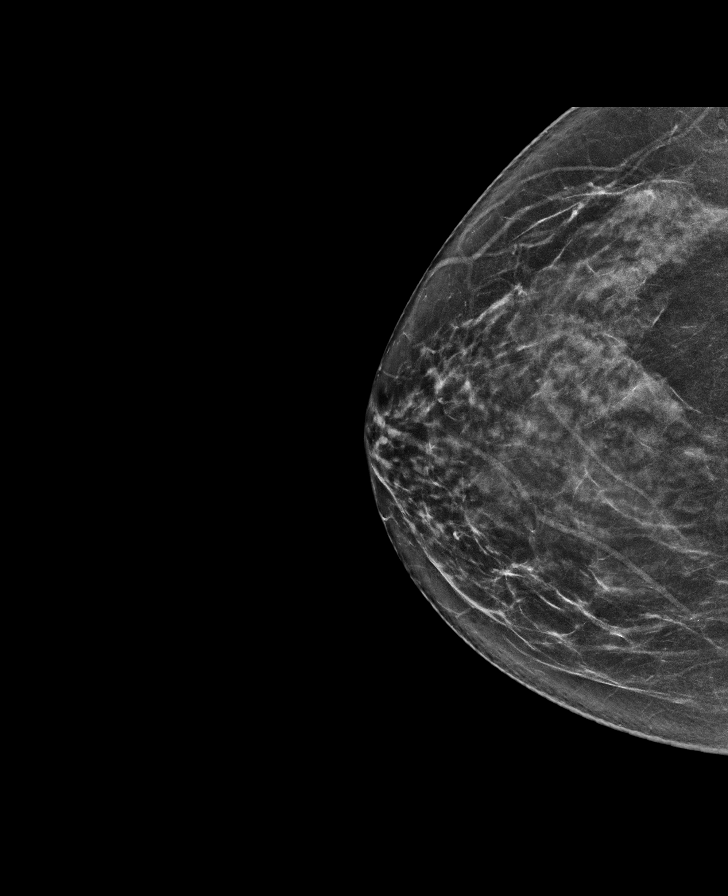

[L CC tomo · tomo slice 37/73.0]
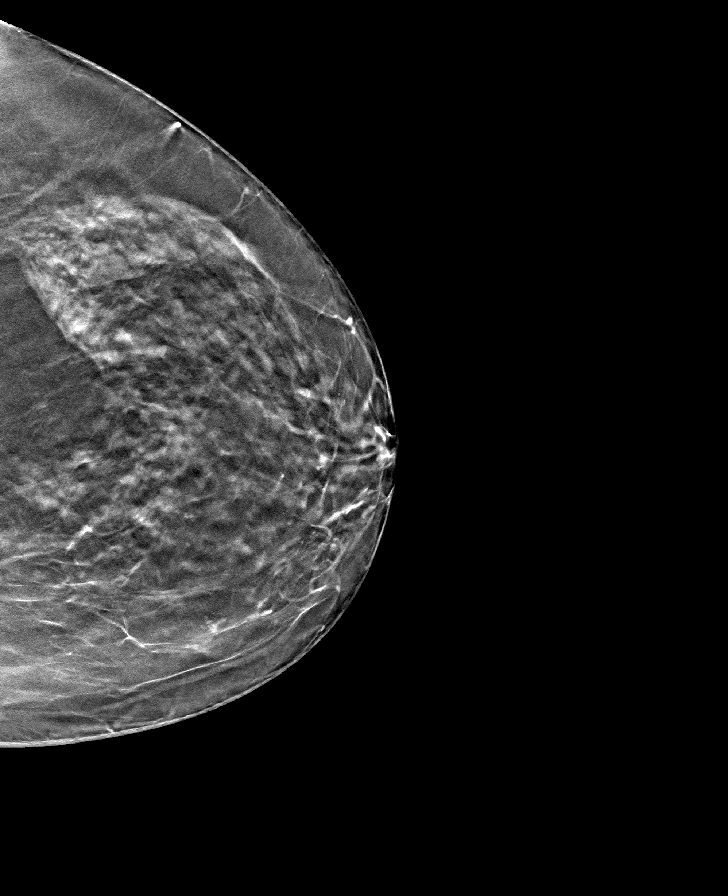

[L MLO tomo · tomo slice 41/80.0]
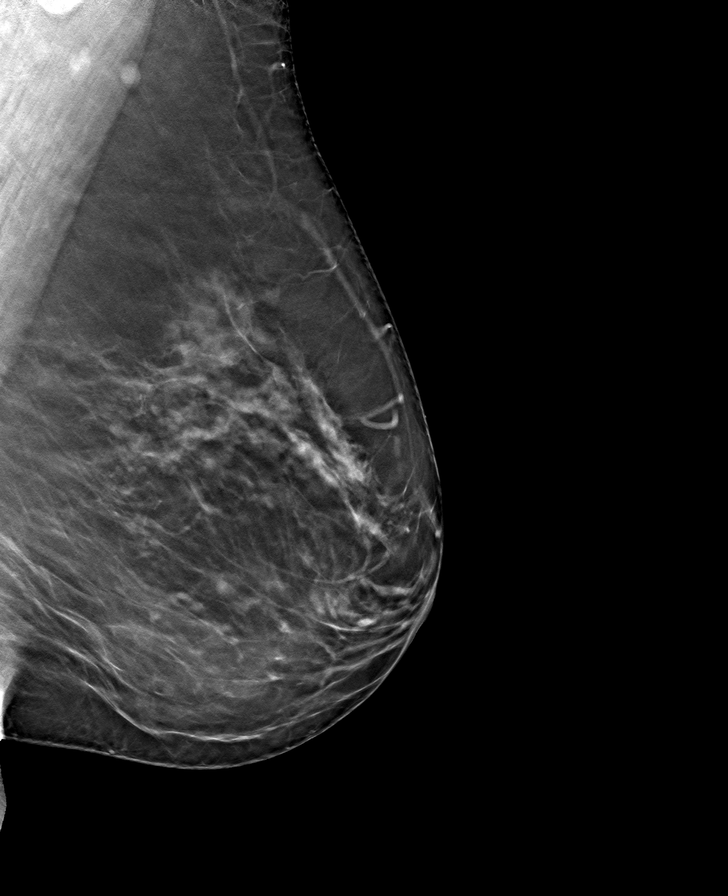

[R CC tomo · tomo slice 37/73.0]
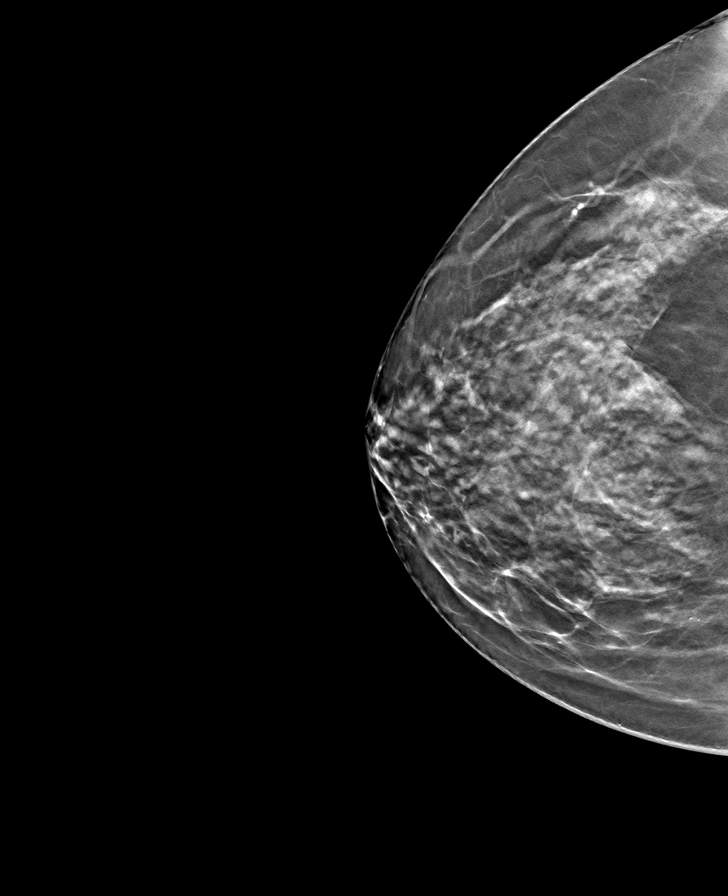

[R MLO tomo · tomo slice 37/74.0]
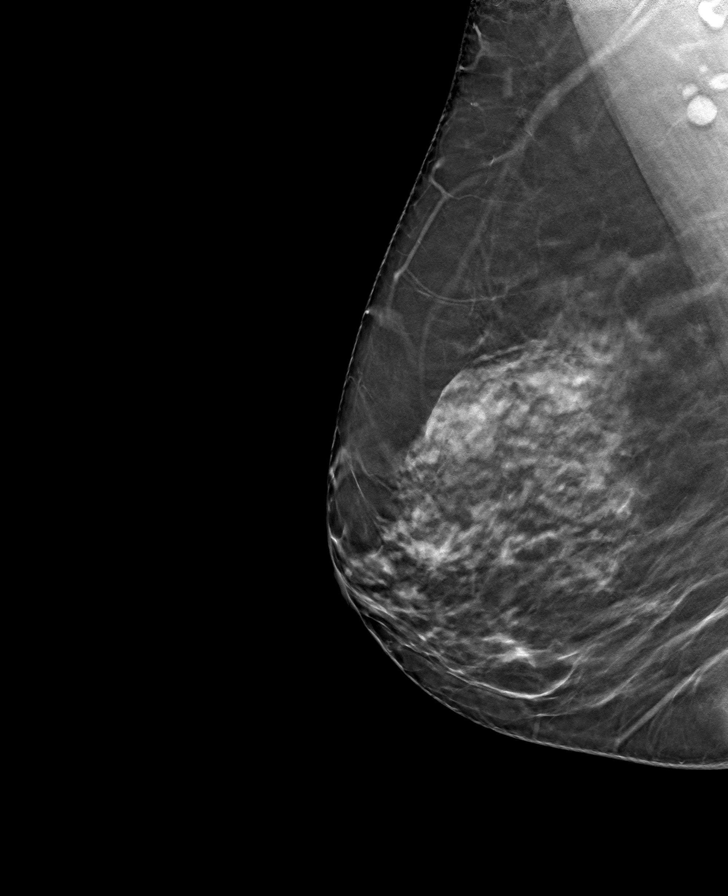

[8 of 24 positions shown; findings below may reference images not displayed]

ACR Breast Density Category b: There are scattered areas of
fibroglandular density.
FINDINGS: There are no findings suspicious for malignancy. Images were
processed with CAD.
IMPRESSION: No mammographic evidence of malignancy. A result letter of this
screening mammogram will be mailed directly to the patient.

RECOMMENDATION:
Screening mammogram in one year. (Code:[id number])

BI-RADS CATEGORY  1: Negative.

## 2021-01-13 ENCOUNTER — Ambulatory Visit: Payer: Managed Care, Other (non HMO) | Admitting: Family Medicine

## 2022-07-21 ENCOUNTER — Other Ambulatory Visit: Payer: Self-pay | Admitting: Family Medicine

## 2022-07-21 DIAGNOSIS — Z1231 Encounter for screening mammogram for malignant neoplasm of breast: Secondary | ICD-10-CM

## 2022-08-10 ENCOUNTER — Ambulatory Visit: Payer: Self-pay

## 2022-08-25 ENCOUNTER — Ambulatory Visit: Payer: Self-pay

## 2022-09-01 ENCOUNTER — Ambulatory Visit
Admission: RE | Admit: 2022-09-01 | Discharge: 2022-09-01 | Disposition: A | Discharge status: Home / Self Care | Payer: Self-pay | Source: Ambulatory Visit | Attending: Family Medicine | Admitting: Family Medicine

## 2022-09-01 DIAGNOSIS — Z1231 Encounter for screening mammogram for malignant neoplasm of breast: Secondary | ICD-10-CM

## 2022-10-05 ENCOUNTER — Ambulatory Visit: Payer: Self-pay

## 2023-08-03 ENCOUNTER — Other Ambulatory Visit: Payer: Self-pay | Admitting: Family Medicine

## 2023-08-03 DIAGNOSIS — Z0000 Encounter for general adult medical examination without abnormal findings: Secondary | ICD-10-CM

## 2023-09-03 ENCOUNTER — Ambulatory Visit
Admission: RE | Admit: 2023-09-03 | Discharge: 2023-09-03 | Disposition: A | Discharge status: Home / Self Care | Payer: BC Managed Care – PPO | Source: Ambulatory Visit | Attending: Family Medicine | Admitting: Family Medicine

## 2023-09-03 DIAGNOSIS — Z0000 Encounter for general adult medical examination without abnormal findings: Secondary | ICD-10-CM

## 2024-08-22 ENCOUNTER — Other Ambulatory Visit: Payer: Self-pay | Admitting: Family Medicine

## 2024-08-22 DIAGNOSIS — Z1231 Encounter for screening mammogram for malignant neoplasm of breast: Secondary | ICD-10-CM

## 2024-09-26 NOTE — Telephone Encounter (Signed)
 Referral sent to [facility] for radiation oncology appt. They will review and call pt with an appt.

## 2024-10-05 ENCOUNTER — Inpatient Hospital Stay
Admission: RE | Admit: 2024-10-05 | Discharge: 2024-10-05 | Disposition: A | Discharge status: Home / Self Care | Payer: Self-pay | Source: Ambulatory Visit | Attending: Radiation Oncology | Admitting: Radiation Oncology

## 2024-10-05 ENCOUNTER — Other Ambulatory Visit: Payer: Self-pay

## 2024-10-05 DIAGNOSIS — C411 Malignant neoplasm of mandible: Secondary | ICD-10-CM

## 2024-10-10 ENCOUNTER — Ambulatory Visit: Admission: RE | Admit: 2024-10-10 | Discharge: 2024-10-10 | Attending: Radiation Oncology

## 2024-10-10 ENCOUNTER — Encounter: Payer: Self-pay | Admitting: Radiation Oncology

## 2024-10-10 ENCOUNTER — Ambulatory Visit
Admission: RE | Admit: 2024-10-10 | Discharge: 2024-10-10 | Attending: Radiation Oncology | Admitting: Radiation Oncology

## 2024-10-10 VITALS — BP 180/100 | HR 81 | Temp 97.5°F | Resp 18 | Ht 69.0 in | Wt 205.6 lb

## 2024-10-10 DIAGNOSIS — L309 Dermatitis, unspecified: Secondary | ICD-10-CM | POA: Diagnosis not present

## 2024-10-10 DIAGNOSIS — M25471 Effusion, right ankle: Secondary | ICD-10-CM | POA: Diagnosis not present

## 2024-10-10 DIAGNOSIS — C411 Malignant neoplasm of mandible: Secondary | ICD-10-CM | POA: Diagnosis present

## 2024-10-10 DIAGNOSIS — I890 Lymphedema, not elsewhere classified: Secondary | ICD-10-CM | POA: Diagnosis not present

## 2024-10-10 HISTORY — DX: Malignant neoplasm of bone and articular cartilage, unspecified: C41.9

## 2024-10-10 NOTE — Progress Notes (Signed)
 Head and Neck Cancer Location of Tumor / Histology:  Squamous Cell Carcinoma of Right Mandible   Patient presented months ago with symptoms of:  Jaw pain and some swelling.  Biopsies revealed:    Nutrition Status Yes No Comments  Weight changes? [x]  []  Lost about 20 pounds since surgery   Swallowing concerns? []  [x]    PEG? []  [x]     Referrals Yes No Comments  Social Work? [x]  []    Dentistry? [x]  []    Swallowing therapy? [x]  []    Nutrition? [x]  []    Med/Onc? [x]  []     Safety Issues Yes No Comments  Prior radiation? []  [x]    Pacemaker/ICD? []  [x]    Possible current pregnancy? []  [x]    Is the patient on methotrexate? []  [x]     Tobacco/Marijuana/Snuff/ETOH use: None  Past/Anticipated interventions by otolaryngology, if any:  Dr. Denys 08/24/2024 Right Segmental Mandibulectomy       Current Complaints / other details:  None    /

## 2024-10-10 NOTE — Progress Notes (Signed)
 Oncology Nurse Navigator Documentation   Met with patient during initial consult with Dr. Izell. She was accompanied by her husband Lonell.  Further introduced myself as her/their Navigator, explained my role as a member of the Care Team. Provided New Patient resource guide binder: Contact information for physicians, this navigator, other members of the Care Team Advance Directive information; provided [facility] AD booklet at their request,  Fall Prevention Patient Safety Plan Financial Assistance Information sheet Symptom Management Clinic information [facility] map with highlight of [facility] SLP Information sheet Head and Neck cancer basics Nutrition information Patient and family support information including Spiritual care/Chaplain information, Peer mentor program, health and wellness classes, and the survivorship program Community resources  Assisted with post-consult appt scheduling. I have contacted Dr. Nila office at [facility] and sent a referral for dental clearance and SPDs.  I toured her to the [facility] treatment area, explained procedures for lobby registration, arrival to Radiation Waiting, and arrival to treatment area.    They verbalized understanding of information provided. I encouraged them to call with questions/concerns moving forward.  Delon Jefferson, RN, BSN, OCN Head & Neck Oncology Nurse Navigator [facility] [phone]

## 2024-10-10 NOTE — Progress Notes (Signed)
 Radiation Oncology         [phone] ________________________________  Initial outpatient Consultation  Name: Mackenzie Glenn MRN: [id number]  Date: 10/10/2024  DOB: November 26, 1965  RR:[id number], No Pcp Per  Denys Reyes Lunger, MD   REFERRING PHYSICIAN: Denys Reyes Lunger, MD  DIAGNOSIS: No diagnosis found.   Cancer Staging  No matching staging information was found for the patient.  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  CHIEF COMPLAINT: Here to discuss management of head/neck cancer  HISTORY OF PRESENT ILLNESS::Mackenzie Glenn is a 58 y.o. female who presented with a persistent lesion in the right mandible that has persisted over the past 20 years. She underwent an excision on 06/20/24 along with a primary repair of the right inferior alveolar nerve and 2 teeth extractions under the care of Dr. Delford. Biopsy pre-operatively demonstrated a dentigerous cyst, which is also what the lesion appeared clinical intraoperatively. Unfortunately, her final pathology demonstrated moderately differentiated, keratinized, invasive squamous cell carcinoma with a background of inflamed dentigerous cyst.   Subsequently, the patient saw Dr. Bari on 07/06/24 who opted to first proceed with CT maxillofacial, STN, and chest to evaluate for metastatic disease. CT max/face performed on 07/11/24 showed a 5.5 cm expansile lytic lesion right mandible with associated cortical destruction and enhancing soft tissue without lymphadenopathy or CT evidence of perineural spread of tumor. CT chest was negative for any pulmonary embolism.    She was then seen in consultation by Dr. Denys on 07/19/24 to discuss further plan. They discussed possibly undergo segmental mandibulectomy with free flap reconstruction, however case will be presented to the tumor board before proceeding. Upon further discussion. She underwent a right segmental mandibulectomy on 08/24/24 and a free flap take back due to  hematoma and necrotic tissue on 08/28/24. Surgical pathology indicated moderately differentiated, keratinizing invasive squamous cell carcinoma with tumor size of 0.6 cm. Tumor invades the bone along with perineural invasion involving the large caliber nerve. Right neck level IB positive for metastatic disease in 2 of 5 lymph nodes. All other lymph nodes are negative for invasive carcinoma.   During her most recent follow up with Dr. Denys on 09/13/24, patient reported recovering well from procedure. He had also recommended undergoing radiation therapy for 4-6 weeks following her procedure.   Swallowing issues, if any: ***  Weight Changes: ***  Pain status: ***  Other symptoms: ***  Tobacco history, if any: none   ETOH abuse, if any: social alcohol use   Prior cancers, if any: none   PATHOLOGY: 08-24-24 A: Right hemimandible lateral margin, biopsy - Benign squamous mucosa and submucosa - Negative for high-grade dysplasia or malignancy   B: Right hemimandible medial margin, biopsy - Benign squamous mucosa and submucosa - Negative for high-grade dysplasia or malignancy   C: Right hemimandible anterior marrow margin, biopsy - Benign bone - Negative for carcinoma   D: Neck level 1A, lymphadenectomy - Four lymph nodes, negative for malignancy (0/4)   E : Right neck level 1B, lymphadenectomy - Four lymph nodes, negative for malignancy (0/4) - Benign salivary gland   F: Right neck level 2A, lymphadenectomy - Metastatic squamous cell carcinoma in two of five lymph nodes (2/5) - Maximum size of metastasis 1.7 cm - Negative for extranodal extension   G: Right neck level 2B, lymphadenectomy - Sixteen lymph nodes, negative for malignancy (0/16)   H: Right neck level 3, lymphadenectomy - Fifteen lymph nodes, negative for malignancy (0/15)   I: Right neck level 4,  lymphadenectomy - Fourteen lymph nodes, negative for malignancy (0/14)   J: Right hemimandible,  hemimandibulectomy - Invasive squamous cell carcinoma, moderately differentiated, keratinizing    - Tumor size: 0.6 cm    - Tumor invades bone    - Perineural invasion present, involving large caliber nerve    - Margins: Negative (closest margin is medial soft tissue margin at 0.2 cm) - Post-surgical changes present - See comment   This electronic signature is attestation that the pathologist personally reviewed the submitted material(s) and the final diagnosis reflects that evaluation.  Electronically signed by Larraine Pinal, MD on 09/08/2024 at 2136 EST  Diagnosis Comment   The frozen section diagnoses are confirmed.   This case is reviewed in conjunction with the patient's prior curettage specimen ([id number]), which showed invasive squamous cell carcinoma rising in a dentigerous cyst with background high-grade squamous dysplasia.  In this mandibulectomy specimen, the tumor consists of a 0.6 cm submucosal deposit with focal extension into bone and focal extension to the mucosal surface with associated ulceration.  There is perineural invasion of a large caliber nerve (favored to be the inferior alveolar nerve).   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  Primary intraosseous carcinoma is a rare central jaw carcinoma that arises from odontogenic cysts or other benign odontogenic precursors.  Although there is no defined AJCC TNM staging system for these tumors, outcomes are similar to stage IV oral cavity squamous cell carcinomas (PMID: [id number]).  Thus, using the oral cavity squamous cell carcinoma staging system as a surrogate to predict clinical behavior and the features of this case, this tumor would be staged as a pT4a N2b.   PREVIOUS RADIATION THERAPY:  No  PAST MEDICAL HISTORY:  has no past medical history on file.    PAST SURGICAL HISTORY: Past Surgical History:  Procedure Laterality Date   bilateral hip replacement  2017   right Feb 2017, Left April 2017    FAMILY HISTORY: family history includes Arthritis in her mother; CVA (age of onset: 16) in her mother; Hypertension in her mother; Hypothyroidism in her mother.  SOCIAL HISTORY:  reports that she has never smoked. She has never used smokeless tobacco. She reports current alcohol use of about 6.0 standard drinks of alcohol per week. She reports that she does not use drugs.  ALLERGIES: Penicillin g, Tetracycline, and Sulfamethoxazole  MEDICATIONS:  Current Outpatient Medications  Medication Sig Dispense Refill   famciclovir  (FAMVIR ) 500 MG tablet Take 1 tablet (500 mg total) by mouth 3 (three) times daily. 60 tablet 0   No current facility-administered medications for this visit.    REVIEW OF SYSTEMS:  Notable for that above.   PHYSICAL EXAM:  vitals were not taken for this visit.   General: Alert and oriented, in no acute distress HEENT: Head is normocephalic. Extraocular movements are intact. Oropharynx is notable for ***. Neck: Neck is notable for *** Heart: Regular in rate and rhythm with no murmurs, rubs, or gallops. Chest: Clear to auscultation bilaterally, with no rhonchi, wheezes, or rales. Abdomen: Soft, nontender, nondistended, with no rigidity or guarding. Extremities: No cyanosis or edema. Lymphatics: see Neck Exam Skin: No concerning lesions. Musculoskeletal: symmetric strength and muscle tone throughout. Neurologic: Cranial nerves II through XII are grossly intact. No obvious focalities. Speech is fluent. Coordination is intact.  Psychiatric: Judgment and insight are intact. Affect is appropriate.   ECOG = ***  0 - Asymptomatic (Fully active, able to carry on all predisease activities without restriction)  1 - Symptomatic but completely ambulatory  (Restricted in physically strenuous activity but ambulatory and able to carry out work of a light or sedentary nature. For example, light housework, office work)  2 - Symptomatic, <50% in bed during the day (Ambulatory and capable of all self care but unable to carry out any work activities. Up and about more than 50% of waking hours)  3 - Symptomatic, >50% in bed, but not bedbound (Capable of only limited self-care, confined to bed or chair 50% or more of waking hours)  4 - Bedbound (Completely disabled. Cannot carry on any self-care. Totally confined to bed or chair)  5 - Death   Raylene MM, Creech RH, Tormey DC, et al. [phone]). Toxicity and response criteria of the [facility]. Am. DOROTHA Bridges. Oncol. 5 (6): 649-55   LABORATORY DATA:  Lab Results  Component Value Date   WBC 5.1 08/31/2016   HGB 13.4 08/31/2016   HCT 40.4 08/31/2016   MCV 95.3 08/31/2016   PLT 309 08/31/2016   CMP     Component Value Date/Time   NA 140 08/31/2016 0918   K 4.2 08/31/2016 0918   CL 105 08/31/2016 0918   CO2 26 08/31/2016 0918   GLUCOSE 97 08/31/2016 0918   BUN 12 08/31/2016 0918   CREATININE 0.96 08/31/2016 0918   CALCIUM 9.6 08/31/2016 0918   PROT 7.1 08/31/2016 0918   ALBUMIN 4.5 08/31/2016 0918   AST 22 08/31/2016 0918   ALT 28 08/31/2016 0918   ALKPHOS 92 08/31/2016 0918   BILITOT 0.5 08/31/2016 0918      Lab Results  Component Value Date   TSH 2.46 08/31/2016     RADIOGRAPHY: No results found.    IMPRESSION/PLAN:  This is a delightful patient with head and neck cancer. I *** recommend radiotherapy for this patient.  We discussed the potential risks, benefits, and side effects of radiotherapy. We talked in detail about acute and late effects. We discussed that some of the most bothersome acute effects may be mucositis, dysgeusia, salivary changes, skin irritation, hair loss, dehydration, weight loss and fatigue. We talked about late effects which include but  are not necessarily limited to dysphagia, hypothyroidism, nerve injury, vascular injury, spinal cord injury, xerostomia, trismus, neck edema, dental issues, non-healing wound, and potentially fatal injury to any of the tissues in the head and neck region. No guarantees of treatment were given. A consent form was signed and placed in the patient's medical record. The patient is enthusiastic about proceeding with treatment. I look forward to participating in the patient's care.    Simulation (treatment planning) will take place ***  We also discussed that the treatment of head and neck cancer is a multidisciplinary process to maximize treatment outcomes and quality of life. For this reason the following referrals have been or will be made:  *** Medical oncology to discuss chemotherapy   *** Dentistry for dental evaluation, possible extractions in the radiation fields, and /or advice on reducing risk of cavities, osteoradionecrosis, or other oral issues.  *** Nutritionist for nutrition support during and after treatment.  *** Speech language pathology for swallowing and/or speech therapy.  *** Social work for social support.   *** Physical therapy due to risk of lymphedema in neck and deconditioning.  *** Baseline labs including TSH.  On date of service, in total, I spent *** minutes on this encounter. Patient was seen in person.  __________________________________________   Lauraine Golden, MD  This document serves as a record of services personally performed by Lauraine Golden, MD. It was created on her behalf by Reymundo Cartwright, a trained medical scribe. The creation of this record is based on the scribe's personal observations and the provider's statements to them. This document has been checked and approved by the attending provider.

## 2024-10-11 ENCOUNTER — Encounter: Payer: Self-pay | Admitting: Radiation Oncology

## 2024-10-11 DIAGNOSIS — C411 Malignant neoplasm of mandible: Secondary | ICD-10-CM | POA: Insufficient documentation

## 2024-10-11 NOTE — Progress Notes (Signed)
 Dental Form with Estimates of Radiation Dose  BILLIJO DILLING Date of birth: November 16, 1965        Cancer Staging  Cancer of mandible [facility]) Staging form: Oral Cavity, AJCC 8th Edition - Pathologic stage from 10/11/2024: Stage IVA (pT4a, pN2b, cM0) - Signed by Izell Domino, MD on 10/11/2024 Stage prefix: Initial diagnosis   Prognosis: curative  Anticipated # of fractions: 30    Daily?: yes  # of weeks of radiotherapy: 6  Chemotherapy?: no  Anticipated xerostomia:  Mild permanent    Pre-simulation needs:   Scatter protection / dental clearance and advice  Simulation: ASAP    Other Notes:   Please contact Domino Izell, MD, with patient's disposition after evaluation and/or dental treatment. -----------------------------------  Domino Izell, MD

## 2024-10-23 ENCOUNTER — Other Ambulatory Visit: Payer: Self-pay

## 2024-10-23 DIAGNOSIS — C411 Malignant neoplasm of mandible: Secondary | ICD-10-CM

## 2024-10-23 NOTE — Progress Notes (Signed)
 Has armband been applied?  Yes.    Does patient have an allergy to IV contrast dye?: No.   Has patient ever received premedication for IV contrast dye?: No.   Date of lab work: October 31, 2024 BUN: 13 CR: 0.94 eGFR: >60  Does patient take metformin?: No.   IV site: antecubital left, condition patent and no redness  Has IV site been added to flowsheet?  Yes.    BP (!) 146/77 (BP Location: Left Arm, Patient Position: Sitting)   Pulse 81   Temp (!) 97.5 F (36.4 C)   Resp 16   Wt 203 lb (92.1 kg)   LMP 09/26/2007   SpO2 100%   BMI 29.98 kg/m

## 2024-10-31 ENCOUNTER — Ambulatory Visit: Admission: RE | Admit: 2024-10-31 | Discharge: 2024-10-31 | Disposition: A | Source: Ambulatory Visit

## 2024-10-31 ENCOUNTER — Ambulatory Visit
Admission: RE | Admit: 2024-10-31 | Discharge: 2024-10-31 | Disposition: A | Discharge status: Home / Self Care | Source: Ambulatory Visit | Attending: Radiation Oncology | Admitting: Radiation Oncology

## 2024-10-31 ENCOUNTER — Other Ambulatory Visit: Payer: Self-pay

## 2024-10-31 ENCOUNTER — Ambulatory Visit
Admission: RE | Admit: 2024-10-31 | Discharge: 2024-10-31 | Disposition: A | Source: Ambulatory Visit | Attending: Radiation Oncology

## 2024-10-31 ENCOUNTER — Ambulatory Visit
Admission: RE | Admit: 2024-10-31 | Discharge: 2024-10-31 | Disposition: A | Source: Ambulatory Visit | Attending: Family Medicine | Admitting: Family Medicine

## 2024-10-31 ENCOUNTER — Encounter

## 2024-10-31 VITALS — BP 146/77 | HR 81 | Temp 97.5°F | Resp 16 | Wt 203.0 lb

## 2024-10-31 DIAGNOSIS — C411 Malignant neoplasm of mandible: Secondary | ICD-10-CM | POA: Diagnosis present

## 2024-10-31 DIAGNOSIS — Z510 Encounter for antineoplastic radiation therapy: Secondary | ICD-10-CM | POA: Diagnosis present

## 2024-10-31 DIAGNOSIS — Z1231 Encounter for screening mammogram for malignant neoplasm of breast: Secondary | ICD-10-CM

## 2024-10-31 LAB — BASIC METABOLIC PANEL - CANCER CENTER ONLY
Anion gap: 9 (ref 5–15)
BUN: 13 mg/dL (ref 6–20)
CO2: 27 mmol/L (ref 22–32)
Calcium: 10.1 mg/dL (ref 8.9–10.3)
Chloride: 103 mmol/L (ref 98–111)
Creatinine: 0.94 mg/dL (ref 0.44–1.00)
GFR, Estimated: >60 mL/min
Glucose, Bld: 101 mg/dL — ABNORMAL HIGH (ref 70–99)
Potassium: 4.7 mmol/L (ref 3.5–5.1)
Sodium: 139 mmol/L (ref 135–145)

## 2024-10-31 NOTE — Progress Notes (Signed)
 Oncology Nurse Navigator Documentation   To provide support, encouragement and care continuity, met with Ms. Labell and her husband before her CT SIM.  She tolerated procedure without difficulty, denied questions/concerns.     I encouraged them to call me prior to 11/09/24 New Start.   Delon Jefferson RN, BSN, OCN Head & Neck Oncology Nurse Navigator [facility] at [facility] Phone # [phone]  Fax # [phone]

## 2024-11-01 ENCOUNTER — Telehealth: Payer: Self-pay

## 2024-11-01 NOTE — Telephone Encounter (Signed)
 [facility] Clinical Social Work  Clinical Social Work was referred by statistician for assessment of psychosocial needs.  Clinical Social Worker attempted to contact patient by phone to offer support and assess for needs.  No Answer, left vm. Will make final attempt.   Lizbeth Sprague, LCSW  Clinical Social Worker [facility]

## 2024-11-02 ENCOUNTER — Ambulatory Visit: Payer: Self-pay | Admitting: Family Medicine

## 2024-11-06 DIAGNOSIS — Z510 Encounter for antineoplastic radiation therapy: Secondary | ICD-10-CM | POA: Diagnosis not present

## 2024-11-09 ENCOUNTER — Other Ambulatory Visit: Payer: Self-pay

## 2024-11-09 ENCOUNTER — Ambulatory Visit
Admission: RE | Admit: 2024-11-09 | Discharge: 2024-11-09 | Disposition: A | Source: Ambulatory Visit | Attending: Radiation Oncology | Admitting: Radiation Oncology

## 2024-11-09 DIAGNOSIS — Z510 Encounter for antineoplastic radiation therapy: Secondary | ICD-10-CM | POA: Diagnosis not present

## 2024-11-09 LAB — RAD ONC ARIA SESSION SUMMARY
Course Elapsed Days: 0
Plan Fractions Treated to Date: 1
Plan Prescribed Dose Per Fraction: 2 Gy
Plan Total Fractions Prescribed: 30
Plan Total Prescribed Dose: 60 Gy
Reference Point Dosage Given to Date: 2 Gy
Reference Point Session Dosage Given: 2 Gy
Session Number: 1

## 2024-11-09 NOTE — Progress Notes (Signed)
 Oncology Nurse Navigator Documentation  To provide support, encouragement and care continuity, met with Ms. Gunnells for her initial RT.  She was accompanied by her husband. I reviewed the 2-step treatment process, answered questions.  Ms. Thorp completed treatment without difficulty, denied questions/concerns. I reviewed the registration/arrival procedure for subsequent treatments. I encouraged them to call me with questions/concerns as treatments proceed.   Delon Jefferson RN, BSN, OCN Head & Neck Oncology Nurse Navigator [facility] at [facility] Phone # [phone]  Fax # [phone]

## 2024-11-10 ENCOUNTER — Ambulatory Visit
Admission: RE | Admit: 2024-11-10 | Discharge: 2024-11-10 | Disposition: A | Discharge status: Home / Self Care | Source: Ambulatory Visit | Attending: Radiation Oncology | Admitting: Radiation Oncology

## 2024-11-10 ENCOUNTER — Other Ambulatory Visit: Payer: Self-pay

## 2024-11-10 DIAGNOSIS — Z510 Encounter for antineoplastic radiation therapy: Secondary | ICD-10-CM | POA: Diagnosis not present

## 2024-11-10 LAB — RAD ONC ARIA SESSION SUMMARY
Course Elapsed Days: 1
Plan Fractions Treated to Date: 2
Plan Prescribed Dose Per Fraction: 2 Gy
Plan Total Fractions Prescribed: 30
Plan Total Prescribed Dose: 60 Gy
Reference Point Dosage Given to Date: 4 Gy
Reference Point Session Dosage Given: 2 Gy
Session Number: 2

## 2024-11-13 ENCOUNTER — Ambulatory Visit: Admitting: Radiation Oncology

## 2024-11-13 ENCOUNTER — Other Ambulatory Visit: Payer: Self-pay

## 2024-11-13 ENCOUNTER — Ambulatory Visit
Admission: RE | Admit: 2024-11-13 | Discharge: 2024-11-13 | Disposition: A | Source: Ambulatory Visit | Attending: Radiation Oncology

## 2024-11-13 ENCOUNTER — Ambulatory Visit

## 2024-11-13 DIAGNOSIS — C411 Malignant neoplasm of mandible: Secondary | ICD-10-CM

## 2024-11-13 DIAGNOSIS — Z510 Encounter for antineoplastic radiation therapy: Secondary | ICD-10-CM | POA: Diagnosis not present

## 2024-11-13 LAB — RAD ONC ARIA SESSION SUMMARY
Course Elapsed Days: 4
Plan Fractions Treated to Date: 3
Plan Prescribed Dose Per Fraction: 2 Gy
Plan Total Fractions Prescribed: 30
Plan Total Prescribed Dose: 60 Gy
Reference Point Dosage Given to Date: 6 Gy
Reference Point Session Dosage Given: 2 Gy
Session Number: 3

## 2024-11-14 ENCOUNTER — Other Ambulatory Visit: Payer: Self-pay

## 2024-11-14 ENCOUNTER — Other Ambulatory Visit: Payer: Self-pay | Admitting: Radiation Oncology

## 2024-11-14 ENCOUNTER — Ambulatory Visit
Admission: RE | Admit: 2024-11-14 | Discharge: 2024-11-14 | Disposition: A | Source: Ambulatory Visit | Attending: Radiation Oncology

## 2024-11-14 ENCOUNTER — Ambulatory Visit: Admission: RE | Admit: 2024-11-14 | Discharge: 2024-11-14 | Attending: Radiation Oncology

## 2024-11-14 DIAGNOSIS — C411 Malignant neoplasm of mandible: Secondary | ICD-10-CM

## 2024-11-14 DIAGNOSIS — Z510 Encounter for antineoplastic radiation therapy: Secondary | ICD-10-CM | POA: Diagnosis not present

## 2024-11-14 LAB — RAD ONC ARIA SESSION SUMMARY
Course Elapsed Days: 5
Plan Fractions Treated to Date: 4
Plan Prescribed Dose Per Fraction: 2 Gy
Plan Total Fractions Prescribed: 30
Plan Total Prescribed Dose: 60 Gy
Reference Point Dosage Given to Date: 8 Gy
Reference Point Session Dosage Given: 2 Gy
Session Number: 4

## 2024-11-14 MED ORDER — LIDOCAINE VISCOUS HCL 2 % MT SOLN: OROMUCOSAL | 3 refills | Status: AC

## 2024-11-15 ENCOUNTER — Ambulatory Visit
Admission: RE | Admit: 2024-11-15 | Discharge: 2024-11-15 | Disposition: A | Source: Ambulatory Visit | Attending: Radiation Oncology

## 2024-11-15 ENCOUNTER — Other Ambulatory Visit: Payer: Self-pay

## 2024-11-15 DIAGNOSIS — Z510 Encounter for antineoplastic radiation therapy: Secondary | ICD-10-CM | POA: Diagnosis not present

## 2024-11-15 LAB — RAD ONC ARIA SESSION SUMMARY
Course Elapsed Days: 6
Plan Fractions Treated to Date: 5
Plan Prescribed Dose Per Fraction: 2 Gy
Plan Total Fractions Prescribed: 30
Plan Total Prescribed Dose: 60 Gy
Reference Point Dosage Given to Date: 10 Gy
Reference Point Session Dosage Given: 2 Gy
Session Number: 5

## 2024-11-16 ENCOUNTER — Ambulatory Visit
Admission: RE | Admit: 2024-11-16 | Discharge: 2024-11-16 | Disposition: A | Discharge status: Home / Self Care | Source: Ambulatory Visit | Attending: Radiation Oncology | Admitting: Radiation Oncology

## 2024-11-16 ENCOUNTER — Other Ambulatory Visit: Payer: Self-pay

## 2024-11-16 ENCOUNTER — Inpatient Hospital Stay: Attending: Nutrition | Admitting: Nutrition

## 2024-11-16 DIAGNOSIS — Z510 Encounter for antineoplastic radiation therapy: Secondary | ICD-10-CM | POA: Diagnosis not present

## 2024-11-16 LAB — RAD ONC ARIA SESSION SUMMARY
Course Elapsed Days: 7
Plan Fractions Treated to Date: 6
Plan Prescribed Dose Per Fraction: 2 Gy
Plan Total Fractions Prescribed: 30
Plan Total Prescribed Dose: 60 Gy
Reference Point Dosage Given to Date: 12 Gy
Reference Point Session Dosage Given: 2 Gy
Session Number: 6

## 2024-11-16 NOTE — Progress Notes (Signed)
Patient did not come to nutrition appointment.

## 2024-11-17 ENCOUNTER — Other Ambulatory Visit: Payer: Self-pay

## 2024-11-17 ENCOUNTER — Ambulatory Visit
Admission: RE | Admit: 2024-11-17 | Discharge: 2024-11-17 | Disposition: A | Source: Ambulatory Visit | Attending: Radiation Oncology

## 2024-11-17 DIAGNOSIS — Z510 Encounter for antineoplastic radiation therapy: Secondary | ICD-10-CM | POA: Diagnosis not present

## 2024-11-17 LAB — RAD ONC ARIA SESSION SUMMARY
Course Elapsed Days: 8
Plan Fractions Treated to Date: 7
Plan Prescribed Dose Per Fraction: 2 Gy
Plan Total Fractions Prescribed: 30
Plan Total Prescribed Dose: 60 Gy
Reference Point Dosage Given to Date: 14 Gy
Reference Point Session Dosage Given: 2 Gy
Session Number: 7

## 2024-11-20 ENCOUNTER — Ambulatory Visit

## 2024-11-21 ENCOUNTER — Ambulatory Visit
Admission: RE | Admit: 2024-11-21 | Discharge: 2024-11-21 | Disposition: A | Source: Ambulatory Visit | Attending: Radiation Oncology

## 2024-11-21 ENCOUNTER — Inpatient Hospital Stay: Admitting: Dietician

## 2024-11-21 ENCOUNTER — Other Ambulatory Visit: Payer: Self-pay

## 2024-11-21 ENCOUNTER — Ambulatory Visit: Admission: RE | Admit: 2024-11-21 | Discharge: 2024-11-21 | Attending: Radiation Oncology

## 2024-11-21 DIAGNOSIS — Z510 Encounter for antineoplastic radiation therapy: Secondary | ICD-10-CM | POA: Diagnosis not present

## 2024-11-21 LAB — RAD ONC ARIA SESSION SUMMARY
Course Elapsed Days: 12
Plan Fractions Treated to Date: 8
Plan Prescribed Dose Per Fraction: 2 Gy
Plan Total Fractions Prescribed: 30
Plan Total Prescribed Dose: 60 Gy
Reference Point Dosage Given to Date: 16 Gy
Reference Point Session Dosage Given: 2 Gy
Session Number: 8

## 2024-11-21 NOTE — Progress Notes (Signed)
 Nutrition Assessment   Reason for Assessment: HNC  ASSESSMENT: 59 year old with SCC of right mandible. S/p segmental mandibulectomy ([facility]) 07/2024. Patient receiving adjuvant radiation under the care of Dr. Izell   No pertinent past medical history   Met with patient and husband in office following treatment. Patient feeling good today. Tolerating treatment well so far with diminished taste of some foods. Recalls toast having no flavor this morning. Patient endorses good appetite and eats 3 meals + snacks in between. Tolerating regular diet at this time. Reports right sided jaw tightness in the last 4-5 days. She is doing exercises as prescribed at recent [facility] follow-up. Patient reports drinking 60+ ounces of water as well as coffee, tea, and daily ginger ale or coke.   Nutrition Focused Physical Exam: deferred    Medications: famciclovir , xylocaine , MVI   Labs: 1/6 labs reviewed    Anthropometrics:  Height: 5'9 Weight: 209.8 lb (1/20 - aria) UBW: 200 lb  BMI: 30.9    NUTRITION DIAGNOSIS: Predicted sub-optimal intake related to [facility] as evidenced by s/p segmental mandibulectomy, anticipated side effects of therapy (taste changes, dry mouth, thick saliva, sore throat) to affect ability to eat/drink orally   INTERVENTION:  Continue eating 3 meals + snacks, recommend protein source at every meal - soft moist high protein foods list Continue baking soda salt water gargles  Provided samples of ONS for pt to try - discussed these as a tool to maintain nutrition if appetite decreases   MONITORING, EVALUATION, GOAL: Patient will tolerate adequate calories and protein to minimize wt loss during treatment   Next Visit: Tuesday February 3 after RT

## 2024-11-22 ENCOUNTER — Other Ambulatory Visit: Payer: Self-pay

## 2024-11-22 ENCOUNTER — Ambulatory Visit
Admission: RE | Admit: 2024-11-22 | Discharge: 2024-11-22 | Disposition: A | Source: Ambulatory Visit | Attending: Radiation Oncology

## 2024-11-22 LAB — RAD ONC ARIA SESSION SUMMARY
Course Elapsed Days: 13
Plan Fractions Treated to Date: 9
Plan Prescribed Dose Per Fraction: 2 Gy
Plan Total Fractions Prescribed: 30
Plan Total Prescribed Dose: 60 Gy
Reference Point Dosage Given to Date: 18 Gy
Reference Point Session Dosage Given: 2 Gy
Session Number: 9

## 2024-11-23 ENCOUNTER — Ambulatory Visit
Admission: RE | Admit: 2024-11-23 | Discharge: 2024-11-23 | Disposition: A | Discharge status: Home / Self Care | Source: Ambulatory Visit | Attending: Radiation Oncology | Admitting: Radiation Oncology

## 2024-11-23 ENCOUNTER — Other Ambulatory Visit: Payer: Self-pay

## 2024-11-23 LAB — RAD ONC ARIA SESSION SUMMARY
Course Elapsed Days: 14
Plan Fractions Treated to Date: 10
Plan Prescribed Dose Per Fraction: 2 Gy
Plan Total Fractions Prescribed: 30
Plan Total Prescribed Dose: 60 Gy
Reference Point Dosage Given to Date: 20 Gy
Reference Point Session Dosage Given: 2 Gy
Session Number: 10

## 2024-11-24 ENCOUNTER — Ambulatory Visit
Admission: RE | Admit: 2024-11-24 | Discharge: 2024-11-24 | Disposition: A | Source: Ambulatory Visit | Attending: Radiation Oncology

## 2024-11-24 ENCOUNTER — Other Ambulatory Visit: Payer: Self-pay

## 2024-11-24 LAB — RAD ONC ARIA SESSION SUMMARY
Course Elapsed Days: 15
Plan Fractions Treated to Date: 11
Plan Prescribed Dose Per Fraction: 2 Gy
Plan Total Fractions Prescribed: 30
Plan Total Prescribed Dose: 60 Gy
Reference Point Dosage Given to Date: 22 Gy
Reference Point Session Dosage Given: 2 Gy
Session Number: 11

## 2024-11-27 ENCOUNTER — Ambulatory Visit

## 2024-11-28 ENCOUNTER — Ambulatory Visit
Admission: RE | Admit: 2024-11-28 | Discharge: 2024-11-28 | Disposition: A | Source: Ambulatory Visit | Attending: Radiation Oncology

## 2024-11-28 ENCOUNTER — Other Ambulatory Visit: Payer: Self-pay

## 2024-11-28 ENCOUNTER — Inpatient Hospital Stay: Admitting: Dietician

## 2024-11-28 LAB — RAD ONC ARIA SESSION SUMMARY
Course Elapsed Days: 19
Plan Fractions Treated to Date: 12
Plan Prescribed Dose Per Fraction: 2 Gy
Plan Total Fractions Prescribed: 30
Plan Total Prescribed Dose: 60 Gy
Reference Point Dosage Given to Date: 24 Gy
Reference Point Session Dosage Given: 2 Gy
Session Number: 12

## 2024-11-29 ENCOUNTER — Ambulatory Visit
Admission: RE | Admit: 2024-11-29 | Discharge: 2024-11-29 | Disposition: A | Source: Ambulatory Visit | Attending: Radiation Oncology

## 2024-11-29 ENCOUNTER — Other Ambulatory Visit: Payer: Self-pay

## 2024-11-29 LAB — RAD ONC ARIA SESSION SUMMARY
Course Elapsed Days: 20
Plan Fractions Treated to Date: 13
Plan Prescribed Dose Per Fraction: 2 Gy
Plan Total Fractions Prescribed: 30
Plan Total Prescribed Dose: 60 Gy
Reference Point Dosage Given to Date: 26 Gy
Reference Point Session Dosage Given: 2 Gy
Session Number: 13

## 2024-11-29 NOTE — Therapy (Signed)
 " OUTPATIENT PHYSICAL THERAPY HEAD AND NECK BASELINE EVALUATION   Patient Name: Mackenzie Glenn MRN: [id number] DOB:[birth year], 59 y.o., female Today's Date: 11/30/2024  END OF SESSION:  PT End of Session - 11/30/24 0955     Visit Number 1    Number of Visits 9    Date for Recertification  12/28/24    PT Start Time 0903    PT Stop Time 0954    PT Time Calculation (min) 51 min    Activity Tolerance Patient tolerated treatment well    Behavior During Therapy [facility] for tasks assessed/performed          Past Medical History:  Diagnosis Date   Bone cancer [facility])    Past Surgical History:  Procedure Laterality Date   bilateral hip replacement  2017   right Feb 2017, Left April 2017   Patient Active Problem List   Diagnosis Date Noted   Cancer of mandible (HCC) 10/11/2024   Avascular necrosis of bone of right hip (HCC) 12/03/2015   Arthritis of right hip 12/02/2015    PCP: None  REFERRING PROVIDER: Izell Domino, MD  REFERRING DIAG: C41.1 (ICD-10-CM) - Cancer of mandible (HCC)  THERAPY DIAG:  Abnormal posture - Plan: PT plan of care cert/re-cert  Lymphedema, not elsewhere classified - Plan: PT plan of care cert/re-cert  Pain in right ankle and joints of right foot - Plan: PT plan of care cert/re-cert  Other abnormalities of gait and mobility - Plan: PT plan of care cert/re-cert  Disorder of the skin and subcutaneous tissue related to radiation, unspecified - Plan: PT plan of care cert/re-cert  Muscle weakness (generalized) - Plan: PT plan of care cert/re-cert  Malignant neoplasm of mandible (HCC) - Plan: PT plan of care cert/re-cert  Rationale for Evaluation and Treatment: Rehabilitation  ONSET DATE: 08/28/24  SUBJECTIVE:     SUBJECTIVE STATEMENT: Patient reports they are here today to be seen by their medical team for newly diagnosed cancer of mandible.  My foot has been really hurting and I have developed a limp in the past 2-3 weeks.   PERTINENT HISTORY:   SCC of right mandible, stage IVA (T4a N2b M0). She presented with a persistent lesion in the right mandible that had persisted over the past 20 years. 06/20/24 She underwent an excision with primary repair of the right inferior alveolar nerve and 2 teeth extractions under the care of Dr. Delford. Biopsy pre-operatively demonstrated a dentigerous cyst but final pathology showed moderately differentiated keratinized, invasive SCC with a background of inflamed dentigerous cyst. 07/06/24 She saw Dr. Bari [facility]) who ordered imaging: CT max/face performed on 07/11/24 showed a 5.5 cm expansile lytic lesion right mandible with associated cortical destruction and enhancing soft tissue without lymphadenopathy or CT evidence of perineural spread of tumor. CT chest was negative for any pulmonary embolism.  07/19/24 she saw Dr. Denys [facility]) They discussed possibly undergo segmental mandibulectomy with free flap reconstruction, however case will be presented to the tumor board before proceeding. Upon further discussion. She underwent a right segmental mandibulectomy on 08/24/24 and a free flap take back due to hematoma and necrotic tissue on 08/28/24. Surgical pathology indicated moderately differentiated, keratinizing invasive squamous cell carcinoma with tumor size of 0.6 cm. Tumor invades the bone along with perineural invasion involving the large caliber nerve. Right neck level IB positive for metastatic disease in 2 of 5 lymph nodes. All other lymph nodes are negative for invasive carcinoma. She will receive 30 fractions of radiation to her  mandible and right neck which started on 11/09/24 and complete 12/21/24. 08/24/24 right segmental mandibulectomy with free flap reconstruction  PATIENT GOALS:   to be educated about the signs and symptoms of lymphedema and learn post op HEP.   PAIN:  Are you having pain? Yes: NPRS scale: 3/10 Pain location: R foot Pain description: aching Aggravating factors: walking, wearing hard  shoes Relieving factors: exercises  PRECAUTIONS: Active CA and Joint replacement bilateral hip replacements  RED FLAGS: None   WEIGHT BEARING RESTRICTIONS: No  FALLS:  Has patient fallen in last 6 months? Yes. Number of falls 1 - foot slipped off step - was wearing socks, happened in September Does the patient have a fear of falling that limits activity? No Is the patient reluctant to leave the house due to a fear of falling?No  LIVING ENVIRONMENT: Patient lives with: husband Lives in: House/apartment Has following equipment at home: None  OCCUPATION: retired  LEISURE: walks, 2-3x/wk during summer months for about 30 minutes  PRIOR LEVEL OF FUNCTION: Independent   OBJECTIVE: Note: Objective measures were completed at Evaluation unless otherwise noted.  COGNITION: Overall cognitive status: Within functional limits for tasks assessed                  POSTURE:  Forward head and rounded shoulders posture  30 SEC SIT TO STAND: 15 reps in 30 sec without use of UEs which is  Average for patient's age  SHOULDER AROM:   WFL   CERVICAL AROM:   Percent limited  Flexion WFL  Extension WFL  Right lateral flexion WFL  Left lateral flexion WFL  Right rotation WFL  Left rotation WFL    (Blank rows=not tested)  STRENGTH/ROM: R ankle DF: 5/5 R ankle DF ROM - limited to 90 degrees  Decreased ROM in to eversion and inversion (visual assessment) with discomfort with both  GAIT: Assessed: Yes Assistance needed: Independent Ambulation Distance: 10 feet Assistive Device: none Gait pattern: lack of heel toe on R, decreased dorsiflexion on R, foot flat with lateral edge strike Ambulation surface: Level  TREATMENT PERFORMED: 11/30/24- See education section  PATIENT EDUCATION:  Education details: Neck ROM, importance of posture when sitting, standing and lying down, deep breathing, walking program and importance of staying active throughout treatment, CURE article on staying  active, Why exercise? flyer, lymphedema and PT info, need for compression for LE and head and neck for management of swelling, anatomy and physiology of the lymphatic system and how lymphedema is managed with MLD and compression garments, need for scar massage Person educated: Patient Education method: Explanation, Demonstration, Handout Education comprehension: Patient verbalized understanding and returned demonstration  HOME EXERCISE PROGRAM: Patient was instructed today in a home exercise program today for head and neck range of motion exercises. These included active cervical flexion, active cervical extension, active cervical rotation to each direction, upper trap stretch, and shoulder retraction. Patient was encouraged to do these 2-3 times a day, holding for 5 sec each and completing for 5 reps. Pt was educated that once this becomes easier then hold the stretches for 30-60 seconds.    ASSESSMENT:  CLINICAL IMPRESSION: Pt arrives to PT with recently diagnosed cancer of mandible. She will receive 30 fractions of radiation to her mandible and right neck which started on 11/09/24 and complete 12/21/24.  Pt's cervical ROM was [facility]. She demonstrates lymphedema of neck with increased swelling superior to her neck dissection scar. She also has swelling in bilateral feet and ankles but it is  worse on the R side secondary to recent surgery which used her fibula to reconstruct her mandible following her mandibulectomy. She would benefit from compression garments for bilateral LEs from foot to knee and head and neck. She also has increased scar tissue surrounding neck scar and scar from fibula removal on RLE. She now demonstrates abnomal gait with decreased heel/toe on R, decreased dorsiflexion and lateral weight bearing on R foot. Educated pt about signs and symptoms of lymphedema as well as anatomy and physiology of lymphatic system. Educated pt in importance of staying as active as possible throughout  treatment to decrease fatigue as well as head and neck ROM exercises to decrease loss of ROM. Pt would benefit from skilled PT services to improve gait, decrease pain, decrease swelling in bilateral LEs and head and neck, instruct in management of MLD, and assist pt with obtaining appropriate compression garments.   Pt will benefit from skilled therapeutic intervention to improve on the following deficits: Decreased knowledge of precautions and postural dysfunction. Other deficits: Abnormal gait, decreased knowledge of condition, decreased knowledge of use of DME, difficulty walking, decreased ROM, decreased strength, increased edema, increased fascial restrictions, postural dysfunction, and pain  PT treatment/interventions: ADL/self-care home management, pt/family education, therapeutic exercise. Other interventions [id number]- PT Re-evaluation, 97750- Physical Performance Testing, 97110-Therapeutic exercises, 97530- Therapeutic activity, [id number]- Neuromuscular re-education, 97535- Self Care, [id number]- Manual therapy, 97760- Orthotic Initial, [phone]- Orthotic/Prosthetic subsequent, and Patient/Family education  REHAB POTENTIAL: Good  CLINICAL DECISION MAKING: Evolving/moderate complexity  EVALUATION COMPLEXITY: Moderate   GOALS: Goals reviewed with patient? YES  SHORT TERM GOALS:    Name Target Date  Goal status  1 Patient will be able to verbalize understanding of a home exercise program for cervical range of motion, posture, and walking.   Baseline:  No knowledge 11/30/2024 Achieved at eval  2 Patient will be able to verbalize understanding of proper sitting and standing posture. Baseline:  No knowledge 11/30/2024 Achieved at eval  3 Patient will be able to verbalize understanding of lymphedema risk and availability of treatment for this condition Baseline:  No knowledge 11/30/2024 Achieved at eval    LONG TERM GOALS:  1 Pt will demonstrate a return to full cervical ROM and function post operatively  compared to baselines and not demonstrate any signs or symptoms of lymphedema.  Baseline: See objective measurements taken today.  2 Pt will be independent in self MLD for long term management of lymphedema of neck to decrease risk of infection.   3 Pt will be able to ambulate with normal heel strike/toe off and 75% improvement in pain to decrease fall risk and improve mobility.    4 Pt will obtain appropriate compression garments for neck lymphedema and management of bilateral LE swelling from foot to knee to decrease risk of infection and improve comfort.   5  Pt will be independent in a home exercise program for continued stretching and strengthening.     PLAN:  PT FREQUENCY/DURATION: 2x/wk for 4 wks  PLAN FOR NEXT SESSION: measure circumferences of neck, make chip pack, look at her current compression stockings and see if appropriate (may be TED hose), measure for new compression stockings for LEs, eventually would benefit from tribute night with cheek pack on R, R ankle ROM and strength, scar massage for RLE and neck, instruct in MLD for neck    Physical Therapy Information for During and After Head/Neck Cancer Treatment: Lymphedema is a swelling condition that you may be at risk for in your neck  and/or face if you have radiation treatment to the area and/or if you have surgery that includes removing lymph nodes.  There is treatment available for this condition and it is not life-threatening.  Contact your physician or physical therapist with concerns. An excellent resource for those seeking information on lymphedema is the National Lymphedema Network's website.  It can be accessed at www.lymphnet.org If you notice swelling in your neck or face at any time following surgery (even if it is many years from now), please contact your doctor or physical therapist to discuss this.  Lymphedema can be treated at any time but it is easier for you if it is treated early on. If you have had surgery  to your neck, please check with your surgeon about how soon to start doing neck range of motion exercises.  If you are not having surgery, I encourage you to start doing neck range of motion exercises today and continue these while undergoing treatment, UNLESS you have irritation of your skin or soft tissue that is aggravated by doing them.  These exercises are intended to help you prevent loss of range of motion and/or to gain range of motion in your neck (which can be limited by tightening effects of radiation), and NOT to aggravate these tissues if they develop sensitivities from treatment. Neck range of motion exercises should be done to the point of feeling a GENTLE, TOLERABLE stretch only.  You are encouraged to start a walking or other exercise program tomorrow and continue this as much as you are able through and after treatment.  Please feel free to call me with any questions. Florina Lanis Carbon, PT, CLT Physical Therapist and Certified Lymphedema Therapist [facility] [address], [place], [place] [address] [phone] [email or web address]  WALKING  Walking is a great form of exercise to increase your strength, endurance and overall fitness.  A walking program can help you start slowly and gradually build endurance as you go.  Everyone's ability is different, so each person's starting point will be different.  You do not have to follow them exactly.  The are just samples. You should simply find out what's right for you and stick to that program.   In the beginning, you'll start off walking 2-3 times a day for short distances.  As you get stronger, you'll be walking further at just 1-2 times per day.  A. You Can Walk For A Certain Length Of Time Each Day    Walk 5 minutes 3 times per day.  Increase 2 minutes every 2 days (3 times per day).  Work up to 25-30 minutes (1-2 times per day).   Example:   Day 1-2 5 minutes 3 times per  day   Day 7-8 12 minutes 2-3 times per day   Day 13-14 25 minutes 1-2 times per day  B. You Can Walk For a Certain Distance Each Day     Distance can be substituted for time.    Example:   3 trips to mailbox (at road)   3 trips to corner of block   3 trips around the block  C. Go to local high school and use the track.    Walk for distance ____ around track  Or time ____ minutes  D. Walk ____ Jog ____ Run ___   Why exercise?  So many benefits! Here are SOME of them: Heart health, including raising your good cholesterol level and reducing heart rate and blood pressure Lung health,  including improved lung capacity It burns fats, and most of us  can stand to be leaner, whether or not we are overweight. It increases the body's natural painkillers and mood elevators, so makes you feel better. Not only makes you feel better, but look better too Improves sleep Takes a bite out of stress May decrease your risk of many types of cancer If you are currently undergoing cancer treatment, exercise may improve your ability to tolerate treatments including chemotherapy. For everybody, it can improve your energy level. Those with cancer-related fatigue report a 40-50% reduction in this symptom when exercising regularly. If you are a survivor of breast, colon, or prostate cancer, it may decrease your risk of a recurrence. (This may hold for other cancers too, but so far we have data just for these three types.)  How to exercise: Get your doctor's okay. Pick something you enjoy doing, like walking, Zumba, biking, swimming, or whatever. Start at low intensity and time, then gradually increase.  (See walking program handout.) Set a goal to achieve over time.  The American Cancer Society, American Heart Association, and U.S. Dept. of Health and Human Services recommend 150 minutes of moderate exercise, 75 minutes of vigorous exercise, or a combination of both per week. This should be done in  episodes at least 10 minutes long, spread throughout the week.  Need help being motivated? Pick something you enjoy doing, because you'll be more inclined to stick with that activity than something that feels like a chore. Do it with a friend so that you are accountable to each other. Schedule it into your day. Place it on your calendar and keep that appointment just like you do any appointment that you make. Join an exercise group that meets at a specific time.  That way, you have to show up on time, and that makes it harder to procrastinate about doing your workout.  It also keeps you accountable--people begin to expect you to be there. Join a gym where you feel comfortable and not intimidated, at the right cost. Sign up for something that you'll need to be in shape for on a specific date, like a 1K or a 5K to walk or run, a 20 or 30 mile bike ride, a mud run or something like that. If the date is looming, you know you'll need to train to be ready for it.  An added benefit is that many of these are fundraisers for good causes. If you've already paid for a gym membership, group exercise class or event, you might as well work out, so you haven't wasted your money!    [employer], PT 11/30/2024, 11:00 AM                       "

## 2024-11-29 NOTE — Therapy (Incomplete)
 " OUTPATIENT SPEECH LANGUAGE PATHOLOGY ONCOLOGY EVALUATION   Patient Name: Mackenzie Glenn MRN: [id number] DOB:[birth year], 59 y.o., female Today's Date: 11/29/2024  PCP: No PCP on file REFERRING PROVIDER: Izell Domino, MD  END OF SESSION:   Past Medical History:  Diagnosis Date   Bone cancer [facility])    Past Surgical History:  Procedure Laterality Date   bilateral hip replacement  2017   right Feb 2017, Left April 2017   Patient Active Problem List   Diagnosis Date Noted   Cancer of mandible (HCC) 10/11/2024   Avascular necrosis of bone of right hip (HCC) 12/03/2015   Arthritis of right hip 12/02/2015    ONSET DATE: SEE PERTINENT HISTORY BELOW  REFERRING DIAG: Cancer of Mandible   THERAPY DIAG:  No diagnosis found.  Rationale for Evaluation and Treatment: Rehabilitation  SUBJECTIVE:   SUBJECTIVE STATEMENT: Pt was interested in seeing SLP even though rad plan is not to include rad to pharyngeal constrictor/swallowing musculature.  Pt accompanied by: significant other  PERTINENT HISTORY:  SCC of right mandible, stage IVA. She presented with a persistent lesion in the right mandible that had persisted over the past 20 years. 06/20/24 She underwent an excision with primary repair of the right ingerior alveolar nerve and 2 teeth extractions under the care of Dr. Delford. Biopsy pre-operatively demonstrated a dentigerous cyst but final pathology showed moderately differentiated keratinized, invasive SCC with a background of inflamed dentigerous cyst. 07/06/24 She saw Dr. Bari [facility]) who ordered imaging: CT max/face performed on 07/11/24 showed a 5.5 cm expansile lytic lesion right mandible with associated cortical destruction and enhancing soft tissue without lymphadenopathy or CT evidence of perineural spread of tumor. CT chest was negative for any pulmonary embolism.  07/19/24 she saw Dr. Denys [facility]) They discussed possibly undergo segmental mandibulectomy with free flap  reconstruction, however case will be presented to the tumor board before proceeding. Upon further discussion. She underwent a right segmental mandibulectomy on 08/24/24 and a free flap take back due to hematoma and necrotic tissue on 08/28/24. Surgical pathology indicated moderately differentiated, keratinizing invasive squamous cell carcinoma with tumor size of 0.6 cm. Tumor invades the bone along with perineural invasion involving the large caliber nerve. Right neck level IB positive for metastatic disease in 2 of 5 lymph nodes. All other lymph nodes are negative for invasive carcinoma. Consult with Dr. Izell for post operative radiation 10/10/24. She will receive radiation alone. Treatment plan: She will receive 30 fractions of radiation to her mandible and right neck which started on 11/09/24 and complete 12/21/24  PAIN:  Are you having pain? {OPRCPAIN:27236}  FALLS: Has patient fallen in last 6 months?  No  LIVING ENVIRONMENT: Lives with: {OPRC lives with:25569::lives with their family} Lives in: {Lives in:[address]70}  PLOF:  Level of assistance: Independent with ADLs, Independent with IADLs Employment: Full-time employment  PATIENT GOALS: ***  OBJECTIVE:  Note: Objective measures were completed at Evaluation unless otherwise noted. DIAGNOSTIC FINDINGS: See pertinent history above  INSTRUMENTAL SWALLOW STUDY FINDINGS (MBSS) none   COGNITION: Overall cognitive status: Within functional limits for tasks assessed  LANGUAGE: Receptive and Expressive language appeared WNL.  ORAL MOTOR EXAMINATION: Overall status: {OMESLP2:27645} Comments: ***  MOTOR SPEECH: Overall motor speech: {slpimpaired:27210} Level of impairment: {SLP level of impairment:25441} Respiration: {respbreathing:27195} Phonation: {SLP phonation:25439} Resonance: {SLP resonance:25440} Articulation: {SLParticulation:27218} Intelligibility: {SLP Intelligible:25442} Interfering components: {SLP Interfering components  (MS):25444} Effective technique: {SLP effective technique (MS):25445}  SUBJECTIVE DYSPHAGIA REPORTS:  Date of onset: *** Reported symptoms: {dysphagia symptoms:29766}  Current diet: {slpdiet:27196}  Co-morbid voice changes: {yes/no:20286}  FACTORS WHICH MAY INCREASE RISK OF ADVERSE EVENT IN PRESENCE OF ASPIRATION:  General health: well appearing  Risk factors: undergoing rad tx for mandible cancer  CLINICAL SWALLOW ASSESSMENT:   Dentition: {CSE dentition:29769} Vocal quality at baseline: {VQL:27192} Patient directly observed with POs: {POobserved:27199} Feeding: {slp feeding:27200} Liquids provided by: {SLPliquids:27201} Oral phase signs and symptoms: {SLPoralphase:27202} Pharyngeal phase signs and symptoms: {SLPpharyngealphase:27203}   PATIENT REPORTED OUTCOME MEASURES (PROM): {SLPPROM:27095}                                                                                                                            TREATMENT DATE:   11/30/24: Research states the risk for dysphagia increases due to radiation and/or chemotherapy treatment due to a variety of factors. Pt is interested in meeting SLP today even though raad plan will not include application to pharyngeal constrictors or swallowing musculature. Regardless, SLP educated the pt about the possibility of reduced/limited ability for PO intake during rad tx. SLP also educated pt regarding possible changes to swallowing musculature for some head/neck cancer patients following rad tx, and why adherence to dysphagia HEP provided today and PO consumption could inhibit muscle fibrosis following rad tx in some patients, and it could also  mitigate muscle disuse atrophy. SLP informed pt why this would be detrimental to a patient's swallowing status and to pulmonary health. Pt demonstrated understanding of these things to SLP.  SLP then provided HEP of two exercises for pt if she wanted to keep strong swallowing musculature during and after  her RT. SLP demonstrated how to perform effortful swallow and Mendelsohn. After SLP demonstration, pt return demonstrated each exercise. SLP ensured pt performance was correct prior to educating pt on next exercise. Pt required *** cues faded to modified independent to perform HEP. Pt was instructed to complete this program at least 5 days a week, at 20-30 reps a day until 6 months after his or her last day of rad tx, and then x2 a week after that, indefinitely. Among other modifications for days when pt cannot functionally swallow, SLP also suggested pt to perform only non-swallowing tasks on the handout/HEP, and if necessary to cycle through the swallowing portion so the full program of exercises can be completed instead of fatiguing on one of the swallowing exercises and being unable to perform the other swallowing exercises. SLP instructed that swallowing exercises should then be added back into the regimen as pt is able to do so.   PATIENT EDUCATION: Education details: late effects head/neck radiation on swallow function, HEP procedure, and modification to HEP when difficulty experienced with swallowing during and after radiation course Person educated: Patient Education method: Explanation, Demonstration, Verbal cues, and Handouts Education comprehension: verbalized understanding, returned demonstration, verbal cues required, and needs further education   ASSESSMENT:  CLINICAL IMPRESSION: Patient is a 59 y.o. F who was seen today for assessment of swallowing as they undergo radiation/chemoradiation therapy. Today  pt ate {oncofood:27395} and drank {oncoliquids:27394} liquids without overt s/s oral or pharyngeal difficulty. At this time pt swallowing is deemed WNL/WFL with these POs. There are no overt s/s aspiration PNA observed by SLP nor any reported by pt at this time. Pt will need to be seen regularly by SLP in order to assess safety of PO intake, assess the need for any objective swallow  assessment, and ensuring pt is correctly completing the individualized HEP.  OBJECTIVE IMPAIRMENTS: include dysphagia. These impairments are limiting patient from safety when swallowing. Factors affecting potential to achieve goals and functional outcome are none noted today. Patient will benefit from skilled SLP services to address above impairments and improve overall function.   REHAB POTENTIAL: Good     GOALS: Goals reviewed with patient? No   SHORT TERM GOALS: Target: 3rd total session   1. Pt will complete HEP with modified independence in 2 sessions Baseline: Goal status: INITIAL   2.  pt will tell SLP why pt is completing HEP (keep strong musculature) with modified independence Baseline:  Goal status: INITIAL   3.  pt will demo understanding of at least 3 overt s/s aspiration PNA with modified independence Baseline:  Goal status: INITIAL   4.  pt will demo knowledge of how a food journal could hasten return to a more normalized diet Baseline:  Goal status: INITIAL     LONG TERM GOALS: Target: 7th total session   1.  pt will complete HEP with independence over two visits Baseline:  Goal status: INITIAL   2.  pt will describe how to modify HEP over time, and the timeline associated with reduction in HEP frequency with modified independence over two sessions Baseline:  Goal status: INITIAL     PLAN:   SLP FREQUENCY:  once approx every 4 weeks   SLP DURATION:  7 sessions   PLANNED INTERVENTIONS: Aspiration precaution training, Pharyngeal strengthening exercises, Diet toleration management , Trials of upgraded texture/liquids, SLP instruction and feedback, Compensatory strategies, and Patient/family education, [phone] (treatment of swallowing dysfunction and/or oral function for feeding)      Kelson Queenan, CCC-SLP 11/29/2024, 5:14 PM      "

## 2024-11-30 ENCOUNTER — Other Ambulatory Visit: Payer: Self-pay

## 2024-11-30 ENCOUNTER — Ambulatory Visit: Admitting: Physical Therapy

## 2024-11-30 ENCOUNTER — Ambulatory Visit

## 2024-11-30 ENCOUNTER — Encounter: Payer: Self-pay | Admitting: Physical Therapy

## 2024-11-30 ENCOUNTER — Ambulatory Visit
Admission: RE | Admit: 2024-11-30 | Discharge: 2024-11-30 | Disposition: A | Source: Ambulatory Visit | Attending: Radiation Oncology

## 2024-11-30 DIAGNOSIS — M25571 Pain in right ankle and joints of right foot: Secondary | ICD-10-CM

## 2024-11-30 DIAGNOSIS — I890 Lymphedema, not elsewhere classified: Secondary | ICD-10-CM

## 2024-11-30 DIAGNOSIS — C411 Malignant neoplasm of mandible: Secondary | ICD-10-CM

## 2024-11-30 DIAGNOSIS — R1310 Dysphagia, unspecified: Secondary | ICD-10-CM

## 2024-11-30 DIAGNOSIS — M6281 Muscle weakness (generalized): Secondary | ICD-10-CM

## 2024-11-30 DIAGNOSIS — R293 Abnormal posture: Secondary | ICD-10-CM

## 2024-11-30 DIAGNOSIS — L599 Disorder of the skin and subcutaneous tissue related to radiation, unspecified: Secondary | ICD-10-CM

## 2024-11-30 DIAGNOSIS — R2689 Other abnormalities of gait and mobility: Secondary | ICD-10-CM

## 2024-11-30 LAB — RAD ONC ARIA SESSION SUMMARY
Course Elapsed Days: 21
Plan Fractions Treated to Date: 14
Plan Prescribed Dose Per Fraction: 2 Gy
Plan Total Fractions Prescribed: 30
Plan Total Prescribed Dose: 60 Gy
Reference Point Dosage Given to Date: 28 Gy
Reference Point Session Dosage Given: 2 Gy
Session Number: 14

## 2024-11-30 NOTE — Therapy (Signed)
 [place] [facility] [address], [address] [place], [place], [address] Phone: [phone]   Fax:  [phone]  Patient Details  Name: Mackenzie Glenn MRN: [id number] Date of Birth: May 31, 1966 Referring Provider:  Izell Domino, MD  Encounter Date: 11/30/2024   ST - ARRIVE-CANCEL After talking with pt for 8 minutes, SLP learned pt is not having any swallowing issues at current time, and pt's surgeon at [facility] did not suspect pt would need any swallowing consult (per pt). Rad plan does not include exposure to swallowing musculature. Pt and SLP agreed that swallowing eval this morning is not necessary.   SLP reviewed with pt sx she may have if a swallowing evaluation may be necessary. Pt stated she will contact ENT in [facility] or Dr. Izell if these sx occur.   [facility], CCC-SLP 11/30/2024, 10:37 AM  [place] [facility] [address], [address] [place], [place], [address] Phone: [phone]   Fax:  [phone]

## 2024-12-01 ENCOUNTER — Ambulatory Visit: Admission: RE | Admit: 2024-12-01 | Source: Ambulatory Visit

## 2024-12-01 ENCOUNTER — Other Ambulatory Visit: Payer: Self-pay

## 2024-12-01 LAB — RAD ONC ARIA SESSION SUMMARY
Course Elapsed Days: 22
Plan Fractions Treated to Date: 15
Plan Prescribed Dose Per Fraction: 2 Gy
Plan Total Fractions Prescribed: 30
Plan Total Prescribed Dose: 60 Gy
Reference Point Dosage Given to Date: 30 Gy
Reference Point Session Dosage Given: 2 Gy
Session Number: 15

## 2024-12-04 ENCOUNTER — Ambulatory Visit

## 2024-12-05 ENCOUNTER — Ambulatory Visit

## 2024-12-05 ENCOUNTER — Inpatient Hospital Stay: Admitting: Dietician

## 2024-12-06 ENCOUNTER — Ambulatory Visit: Payer: Self-pay | Admitting: Physical Therapy

## 2024-12-06 ENCOUNTER — Ambulatory Visit

## 2024-12-07 ENCOUNTER — Ambulatory Visit

## 2024-12-08 ENCOUNTER — Ambulatory Visit

## 2024-12-11 ENCOUNTER — Ambulatory Visit

## 2024-12-11 ENCOUNTER — Ambulatory Visit: Payer: Self-pay

## 2024-12-12 ENCOUNTER — Ambulatory Visit

## 2024-12-13 ENCOUNTER — Ambulatory Visit

## 2024-12-13 ENCOUNTER — Ambulatory Visit: Payer: Self-pay

## 2024-12-14 ENCOUNTER — Ambulatory Visit

## 2024-12-15 ENCOUNTER — Ambulatory Visit

## 2024-12-18 ENCOUNTER — Ambulatory Visit

## 2024-12-19 ENCOUNTER — Ambulatory Visit

## 2024-12-19 ENCOUNTER — Ambulatory Visit: Payer: Self-pay | Admitting: Physical Therapy

## 2024-12-20 ENCOUNTER — Ambulatory Visit

## 2024-12-21 ENCOUNTER — Ambulatory Visit: Payer: Self-pay | Admitting: Rehabilitation

## 2024-12-21 ENCOUNTER — Ambulatory Visit

## 2024-12-22 ENCOUNTER — Ambulatory Visit

## 2024-12-26 ENCOUNTER — Ambulatory Visit: Payer: Self-pay | Admitting: Physical Therapy

## 2024-12-28 ENCOUNTER — Ambulatory Visit: Payer: Self-pay
